# Patient Record
Sex: Female | Born: 1985 | Hispanic: Yes | Marital: Single | Smoking: Former smoker
Health system: Southern US, Community
[De-identification: ages and names within clinical notes are randomized; demographics above are authoritative.]

## PROBLEM LIST (undated history)

## (undated) ENCOUNTER — Inpatient Hospital Stay (HOSPITAL_COMMUNITY): Payer: Self-pay

## (undated) DIAGNOSIS — I639 Cerebral infarction, unspecified: Secondary | ICD-10-CM

## (undated) HISTORY — PX: NO PAST SURGERIES: SHX2092

---

## 2011-06-10 ENCOUNTER — Encounter (HOSPITAL_COMMUNITY): Payer: Self-pay | Admitting: *Deleted

## 2011-06-10 ENCOUNTER — Inpatient Hospital Stay (HOSPITAL_COMMUNITY): Payer: Self-pay

## 2011-06-10 ENCOUNTER — Inpatient Hospital Stay (HOSPITAL_COMMUNITY)
Admission: AD | Admit: 2011-06-10 | Discharge: 2011-06-10 | Disposition: A | Discharge status: Home / Self Care | Payer: Self-pay | Source: Ambulatory Visit | Attending: Obstetrics & Gynecology | Admitting: Obstetrics & Gynecology

## 2011-06-10 DIAGNOSIS — O034 Incomplete spontaneous abortion without complication: Secondary | ICD-10-CM | POA: Insufficient documentation

## 2011-06-10 DIAGNOSIS — O99891 Other specified diseases and conditions complicating pregnancy: Secondary | ICD-10-CM | POA: Insufficient documentation

## 2011-06-10 DIAGNOSIS — O468X9 Other antepartum hemorrhage, unspecified trimester: Secondary | ICD-10-CM

## 2011-06-10 DIAGNOSIS — O074 Failed attempted termination of pregnancy without complication: Secondary | ICD-10-CM

## 2011-06-10 DIAGNOSIS — O418X90 Other specified disorders of amniotic fluid and membranes, unspecified trimester, not applicable or unspecified: Secondary | ICD-10-CM

## 2011-06-10 NOTE — ED Provider Notes (Signed)
History   The patient is a 25 y.o. year old G76P3003 female at 11.[redacted] weeks gestation who presents to MAU reporting having taken 10 Cytotec pills, unknown dose given to her by a friend to try to induce an abortion on 06/05/11. She reports experiencing cramping and diarrhea for two days, but no bleeding or passage of tissue.   CSN: [id number] Arrival date & time: 06/10/2011  1:43 PM  Chief Complaint  Patient presents with  . Possible Pregnancy    HPI  (Consider location/radiation/quality/duration/timing/severity/associated sxs/prior treatment)  HPI  History reviewed. No pertinent past medical history.  No past surgical history on file.  No family history on file.  History  Substance Use Topics  . Smoking status: Not on file  . Smokeless tobacco: Not on file  . Alcohol Use: Not on file    OB History    Grav Para Term Preterm Abortions TAB SAB Ect Mult Living   5 3 3       3       Review of Systems  Review of Systems  Allergies  Review of patient's allergies indicates no known allergies.  Home Medications  No current outpatient prescriptions on file.  Physical Exam    BP 119/67  Pulse 63  Temp(Src) 98.9 F (37.2 C) (Oral)  Resp 16  Ht 4\' 8"  (1.422 m)  Wt 60.555 kg (133 lb 8 oz)  BMI 29.93 kg/m2  LMP 03/20/2011  Physical Exam Abd: NT Pelvic: UTA uterus well due to body habitus. No VB  ED Course  Procedures (including critical care time)  Labs Reviewed  HCG, QUANTITATIVE, PREGNANCY - Abnormal; Notable for the following:    hCG, Beta Chain, Quant, S 31425 (*)    All other components within normal limits  ABO/RH   US Ob Comp Less 14 Wks SIUP measuring 5.5 weeks. No cardiac activity, but embryo is a threshold for expecting to see cardiac activity. Mod SCH noted.   Assessment: 1. Likely incomplete AB vs early viable IUP too early to see FHR  Plan: 1. F/U in 1 week to repeat US 2. Bleeding precautions 3. Discussed danger of induced abortion w/out medical  supervision.  Dorathy Kinsman 06/10/2011 11:38 PM

## 2011-06-10 NOTE — Progress Notes (Signed)
Pt  Took a home preg test that came positive. She took 10 pills to abort pregnancy.  However, she never bled or passed any tissue.  She did have lower abd cramping followed by at least 2 hrs of diarrhea.

## 2011-06-10 NOTE — Progress Notes (Signed)
Pt states, " A friend of mine gave me Ten Cytotec a week ago to abort my pregnancy.  I started having cramping pain and I passed some mucusy liquid, and then a little bit of blood."

## 2011-06-17 ENCOUNTER — Encounter (HOSPITAL_COMMUNITY): Payer: Self-pay

## 2011-06-17 ENCOUNTER — Inpatient Hospital Stay (HOSPITAL_COMMUNITY): Payer: Self-pay

## 2011-06-17 ENCOUNTER — Inpatient Hospital Stay (HOSPITAL_COMMUNITY)
Admission: AD | Admit: 2011-06-17 | Discharge: 2011-06-17 | Disposition: A | Discharge status: Home / Self Care | Payer: Self-pay | Source: Ambulatory Visit | Attending: Obstetrics and Gynecology | Admitting: Obstetrics and Gynecology

## 2011-06-17 DIAGNOSIS — O021 Missed abortion: Secondary | ICD-10-CM | POA: Insufficient documentation

## 2011-06-17 LAB — HCG, QUANTITATIVE, PREGNANCY: hCG, Beta Chain, Quant, S: 32233 m[IU]/mL — ABNORMAL HIGH (ref <5)

## 2011-06-17 MED ORDER — MISOPROSTOL 200 MCG PO TABS: ORAL_TABLET | ORAL | Status: DC

## 2011-06-17 MED ORDER — HYDROCODONE-ACETAMINOPHEN 5-500 MG PO TABS: 1.0000 | ORAL_TABLET | Freq: Four times a day (QID) | ORAL | Status: AC | PRN

## 2011-06-17 MED ORDER — PROMETHAZINE HCL 25 MG PO TABS: 25.0000 mg | ORAL_TABLET | Freq: Four times a day (QID) | ORAL | Status: AC | PRN

## 2011-06-17 NOTE — ED Provider Notes (Signed)
History   Pt presents today for repeat B-quant and [place]. She states she is doing well and has no complaints. She has a hx of taking 10 pills of some unknown medication to induce an abortion. She denies bleeding or any other problems.  Chief Complaint  Patient presents with  . Follow-up   HPI  OB History    Grav Para Term Preterm Abortions TAB SAB Ect Mult Living   5 3 3       3       No past medical history on file.  No past surgical history on file.  No family history on file.  History  Substance Use Topics  . Smoking status: Not on file  . Smokeless tobacco: Not on file  . Alcohol Use: Not on file    Allergies: No Known Allergies  Prescriptions prior to admission  Medication Sig Dispense Refill  . magnesium hydroxide (MILK OF MAGNESIA) 400 MG/5ML suspension Take 5 mLs by mouth daily as needed. Patient took medication for constipation.         Review of Systems  Constitutional: Negative for fever.  Cardiovascular: Negative for chest pain.  Gastrointestinal: Positive for nausea. Negative for vomiting, abdominal pain, diarrhea and constipation.  Genitourinary: Negative for dysuria, urgency, frequency and hematuria.  Neurological: Negative for dizziness and headaches.  Psychiatric/Behavioral: Negative for depression and suicidal ideas.   Physical Exam   Blood pressure 119/71, pulse 75, temperature 98.4 F (36.9 C), temperature source Oral, resp. rate 16, last menstrual period 03/20/2011.  Physical Exam  Constitutional: She is oriented to person, place, and time. She appears well-developed and well-nourished. No distress.  HENT:  Head: Normocephalic and atraumatic.  Eyes: EOM are normal. Pupils are equal, round, and reactive to light.  GI: Soft. She exhibits no distension. There is no tenderness. There is no rebound and no guarding.  Neurological: She is alert and oriented to person, place, and time.  Skin: Skin is warm and dry. She is not diaphoretic.  Psychiatric:  She has a normal mood and affect. Her behavior is normal. Judgment and thought content normal.    MAU Course  Procedures  Results for orders placed during the hospital encounter of 06/17/11 (from the past 24 hour(s))  HCG, QUANTITATIVE, PREGNANCY     Status: Abnormal   Collection Time   06/17/11 10:51 AM      Component Value Range   hCG, Beta Chain, Quant, S 32233 (*) <5 (mIU/mL)   US shows no growth and meets criteria for failed IUP.  Assessment and Plan  Missed AB: discussed with pt at length via interpreter. Discussed expectant management vs cytotec. Pt desires cytotec. Will give Rx for cytotec, lortab, and phenergan. Discussed diet, activity, risks, and precautions. She will return in 2wks for reevaluation.  Clinton Gallant. Ashya Nicolaisen III, DrHSc, MPAS, PA-C  06/17/2011, 2:18 PM   Henrietta Hoover, PA 06/17/11 1422

## 2011-06-17 NOTE — Progress Notes (Signed)
Pt to MAU for f/u ultrasound for confirmation of viability. Pt denies any pain or bleeding.

## 2011-06-18 ENCOUNTER — Inpatient Hospital Stay (HOSPITAL_COMMUNITY)
Admission: AD | Admit: 2011-06-18 | Discharge: 2011-06-18 | Disposition: A | Discharge status: Home / Self Care | Payer: Self-pay | Source: Ambulatory Visit | Attending: Obstetrics & Gynecology | Admitting: Obstetrics & Gynecology

## 2011-06-18 DIAGNOSIS — O021 Missed abortion: Secondary | ICD-10-CM

## 2011-06-18 NOTE — ED Provider Notes (Signed)
Agree with above note.  Ramesh Moan 06/18/2011 7:59 AM   

## 2011-06-18 NOTE — Progress Notes (Signed)
Hospital translator in with patient for triage. Pt states she had Cytotec on 10-1 and expected to have a lot of pain and bleeding and she did not. Just had questions. Jenean Lindau, PA in triage to answer questions.

## 2011-06-18 NOTE — ED Provider Notes (Signed)
History   Pt presents today to ask questions only. She was given an Rx for cytotec yesterday for a missed ab. The pt did not have as much bleeding as she thought she should, so she was concerned. She denies heavy bleeding now, abd pain, or any other sx at this time. All information was obtained via interpreter.  No chief complaint on file.  HPI  OB History    Grav Para Term Preterm Abortions TAB SAB Ect Mult Living   5 3 3       3       No past medical history on file.  No past surgical history on file.  No family history on file.  History  Substance Use Topics  . Smoking status: Not on file  . Smokeless tobacco: Not on file  . Alcohol Use: Not on file    Allergies: No Known Allergies  Prescriptions prior to admission  Medication Sig Dispense Refill  . HYDROcodone-acetaminophen (VICODIN) 5-500 MG per tablet Take 1 tablet by mouth every 6 (six) hours as needed for pain.  30 tablet  0  . magnesium hydroxide (MILK OF MAGNESIA) 400 MG/5ML suspension Take 5 mLs by mouth daily as needed. Patient took medication for constipation.       . misoprostol (CYTOTEC) 200 MCG tablet Take all four tabs as a single dose by mouth.  4 tablet  0  . promethazine (PHENERGAN) 25 MG tablet Take 1 tablet (25 mg total) by mouth every 6 (six) hours as needed for nausea.  30 tablet  0    Review of Systems  Constitutional: Negative for fever.  Cardiovascular: Negative for chest pain.  Gastrointestinal: Negative for nausea, vomiting, abdominal pain, diarrhea and constipation.  Genitourinary: Negative for dysuria, urgency, frequency and hematuria.  Neurological: Negative for dizziness and headaches.  Psychiatric/Behavioral: Negative for depression and suicidal ideas.   Physical Exam   Blood pressure 140/87, pulse 98, temperature 98.7 F (37.1 C), temperature source Oral, resp. rate 16, last menstrual period 03/20/2011.  Physical Exam  Constitutional: She is oriented to person, place, and time. She  appears well-developed and well-nourished. No distress.  HENT:  Head: Normocephalic and atraumatic.  Eyes: EOM are normal. Pupils are equal, round, and reactive to light.  GI: Soft. She exhibits no distension. There is no tenderness. There is no rebound and no guarding.  Neurological: She is alert and oriented to person, place, and time.  Skin: Skin is warm and dry. She is not diaphoretic.  Psychiatric: She has a normal mood and affect. Her behavior is normal. Judgment and thought content normal.    MAU Course  Procedures    Assessment and Plan  Missed AB: discussed with pt at length including anticipated response with cytotec. Pt has been instructed to return in 2 wks for reevaluation. Reassured pt. Pt states she understands and agrees. All information was given via interpreter.  Clinton Gallant. Rice III, DrHSc, MPAS, PA-C  06/18/2011, 5:40 PM   Henrietta Hoover, PA 06/18/11 1744

## 2011-06-19 NOTE — ED Provider Notes (Signed)
Attestation of Attending Supervision of Advanced Practitioner: Evaluation and management procedures were performed by the PA/NP/CNM/OB Fellow under my supervision/collaboration. Chart reviewed and agree with management and plan.  Estle Sabella A 06/19/2011 10:57 AM

## 2011-06-25 ENCOUNTER — Inpatient Hospital Stay (HOSPITAL_COMMUNITY)
Admission: AD | Admit: 2011-06-25 | Discharge: 2011-06-25 | Disposition: A | Discharge status: Home / Self Care | Payer: Self-pay | Source: Ambulatory Visit | Attending: Family Medicine | Admitting: Family Medicine

## 2011-06-25 ENCOUNTER — Inpatient Hospital Stay (HOSPITAL_COMMUNITY): Payer: Self-pay

## 2011-06-25 ENCOUNTER — Encounter (HOSPITAL_COMMUNITY): Payer: Self-pay | Admitting: *Deleted

## 2011-06-25 DIAGNOSIS — O021 Missed abortion: Secondary | ICD-10-CM | POA: Insufficient documentation

## 2011-06-25 LAB — CBC
Hemoglobin: 11.7 g/dL — ABNORMAL LOW (ref 12.0–15.0)
MCHC: 34.1 g/dL (ref 30.0–36.0)
RDW: 12.7 % (ref 11.5–15.5)
WBC: 9.3 10*3/uL (ref 4.0–10.5)

## 2011-06-25 LAB — HCG, QUANTITATIVE, PREGNANCY: hCG, Beta Chain, Quant, S: 23186 m[IU]/mL — ABNORMAL HIGH (ref <5)

## 2011-06-25 MED ORDER — MISOPROSTOL 200 MCG PO TABS
800.0000 ug | ORAL_TABLET | Freq: Once | ORAL | Status: AC
  Administered 2011-06-25: 800 ug via VAGINAL
  Filled 2011-06-25: qty 4

## 2011-06-25 NOTE — Progress Notes (Signed)
Last week given cytotec for SAB and has no bleeding  Or cramping, 3 home UPT +, concerned that she has not responded to cytotec

## 2011-06-25 NOTE — ED Provider Notes (Signed)
History   The patient is a 25 y.o. year old G60P4004 female who state she took cytotec sold to her by a friend to induce abortion on 06/05/11. She was Dx w/ missed AB on 06/17/11. She was given cytotec which she took orally at home. She returned to MAU today reporting no bleeding or passage of tissue.    CSN: [id number] Arrival date & time: 06/25/2011  1:44 PM  Chief Complaint  Patient presents with  . Threatened Miscarriage    (Consider location/radiation/quality/duration/timing/severity/associated sxs/prior treatment) HPI  No past medical history on file.  No past surgical history on file.  No family history on file.  History  Substance Use Topics  . Smoking status: Not on file  . Smokeless tobacco: Not on file  . Alcohol Use:     OB History    Grav Para Term Preterm Abortions TAB SAB Ect Mult Living   5 4 4       4       Review of Systems: Neg  Allergies  Review of patient's allergies indicates no known allergies.  Home Medications  No current outpatient prescriptions on file.  BP 114/74  Pulse 78  Temp 98.9 F (37.2 C)  Resp 18  Ht 4\' 11"  (1.499 m)  LMP 03/20/2011  Physical Exam:  Abd soft, NT Pelvic: Neg for bleeding or discharge. Cervix long and closed. Uterus slightly enlarged, NT ED Course  Procedures (including critical care time)  Labs Reviewed  HCG, QUANTITATIVE, PREGNANCY - Abnormal; Notable for the following:    hCG, Beta Chain, Quant, S 23186 (*)    All other components within normal limits  CBC - Abnormal; Notable for the following:    Hemoglobin 11.7 (*)    HCT 34.3 (*)    All other components within normal limits     Procedures: Cytotec 800 mcg placed vaginally by CNM  MDM  Assessment: 1. Missed AB  Plan: 1. R/B/I expectant management, repeat cytotec vaginally or D/C were discussed. Pt elects Cytotec PV. 2. Return 07/01/11 for eval. Schedule D/C PRN 3. Bleeding precautions  Dorathy Kinsman 06/24/2011 9:03 PM

## 2011-06-25 NOTE — Progress Notes (Signed)
Pt returns to mau due to no results from cytotec for missed ab.  Denies any bleeding or cramping.

## 2011-06-27 NOTE — ED Provider Notes (Signed)
Attestation of Attending Supervision of Advanced Practitioner: Evaluation and management procedures were performed by the PA/NP/CNM/OB Fellow under my supervision/collaboration. Chart reviewed and agree with management and plan.  ANYANWU,UGONNA A 06/27/2011 10:21 AM   

## 2011-07-03 ENCOUNTER — Encounter (HOSPITAL_COMMUNITY): Payer: Self-pay | Admitting: *Deleted

## 2011-07-03 ENCOUNTER — Inpatient Hospital Stay (HOSPITAL_COMMUNITY): Payer: Self-pay

## 2011-07-03 ENCOUNTER — Inpatient Hospital Stay (HOSPITAL_COMMUNITY)
Admission: AD | Admit: 2011-07-03 | Discharge: 2011-07-03 | Disposition: A | Discharge status: Home / Self Care | Payer: Self-pay | Source: Ambulatory Visit | Attending: Obstetrics & Gynecology | Admitting: Obstetrics & Gynecology

## 2011-07-03 DIAGNOSIS — R109 Unspecified abdominal pain: Secondary | ICD-10-CM | POA: Insufficient documentation

## 2011-07-03 DIAGNOSIS — O039 Complete or unspecified spontaneous abortion without complication: Secondary | ICD-10-CM | POA: Insufficient documentation

## 2011-07-03 LAB — HCG, QUANTITATIVE, PREGNANCY: hCG, Beta Chain, Quant, S: 3486 m[IU]/mL — ABNORMAL HIGH (ref <5)

## 2011-07-03 LAB — CBC
Hemoglobin: 11.1 g/dL — ABNORMAL LOW (ref 12.0–15.0)
MCV: 88.7 fL (ref 78.0–100.0)
Platelets: 247 10*3/uL (ref 150–400)
RBC: 3.62 MIL/uL — ABNORMAL LOW (ref 3.87–5.11)
WBC: 7.6 10*3/uL (ref 4.0–10.5)

## 2011-07-03 NOTE — ED Provider Notes (Signed)
History     Chief Complaint  Patient presents with  . Abdominal Pain   HPI Kaitlin Koch 25 y.o. returns today with heavy bleeding this AM.  Thinks she passed the pregnancy this morning.  Is also having upper abdominal pain with nausea and vomiting earlier this week.  Requesting stronger pain medication as she cannot work with the abdominal pain she is having.  Currently is not having pain.  Is having some vaginal bleeding but less than this morning.   OB History    Grav Para Term Preterm Abortions TAB SAB Ect Mult Living   4 3 3       4       Past Medical History  Diagnosis Date  . No pertinent past medical history     Past Surgical History  Procedure Date  . No past surgeries     No family history on file.  History  Substance Use Topics  . Smoking status: Never Smoker   . Smokeless tobacco: Never Used  . Alcohol Use: No    Allergies: No Known Allergies  Prescriptions prior to admission  Medication Sig Dispense Refill  . acetaminophen (TYLENOL) 325 MG tablet Take 650 mg by mouth every 6 (six) hours as needed.        Marland Kitchen CALCIUM PO Take 1 tablet by mouth daily.        . magnesium hydroxide (MILK OF MAGNESIA) 400 MG/5ML suspension Take 5 mLs by mouth daily as needed. Patient took medication for constipation.       . prenatal vitamin w/FE, FA (PRENATAL 1 + 1) 27-1 MG TABS Take 1 tablet by mouth daily.        . promethazine (PHENERGAN) 25 MG tablet Take 1 tablet (25 mg total) by mouth every 6 (six) hours as needed for nausea.  30 tablet  0    Review of Systems  Gastrointestinal: Positive for nausea and abdominal pain.  Genitourinary:       Vaginal bleeding   Physical Exam   Blood pressure 115/73, pulse 72, temperature 98.7 F (37.1 C), temperature source Oral, resp. rate 20, height 4\' 10"  (1.473 m), last menstrual period 03/20/2011, SpO2 97.00%.  Physical Exam  Nursing note and vitals reviewed. Constitutional: She is oriented to person, place, and time. She  appears well-developed and well-nourished.  HENT:  Head: Normocephalic.  Eyes: EOM are normal.  Neck: Neck supple.  GI: Soft. There is no tenderness.       Reports pain has been in upper midline of abdomen.  Musculoskeletal: Normal range of motion.  Neurological: She is alert and oriented to person, place, and time.  Skin: Skin is warm and dry.  Psychiatric: She has a normal mood and affect.    MAU Course  Procedures  MDM Ultrasound Clinical Data: Evaluate for retained products of conception.  TRANSVAGINAL OB ULTRASOUND  Technique: Transvaginal ultrasound was performed for evaluation of  the gestation as well as the maternal uterus and adnexal regions.  Comparison: 06/25/2011.  Findings: There is no intrauterine gestational sac. Endometrium is  somewhat irregular and heterogeneous in appearance, measuring 10 mm  in thickness. No internal blood flow. Ovaries are unremarkable.  No free fluid.  IMPRESSION:  Mild endometrial irregularity and heterogeneity without internal  flow. While this may represent blood products and/or fluid,  retained products of conception cannot be definitively excluded.   Results for orders placed during the hospital encounter of 07/03/11 (from the past 24 hour(s))  CBC  Status: Abnormal   Collection Time   07/03/11  4:12 PM      Component Value Range   WBC 7.6  4.0 - 10.5 (K/uL)   RBC 3.62 (*) 3.87 - 5.11 (MIL/uL)   Hemoglobin 11.1 (*) 12.0 - 15.0 (g/dL)   HCT 16.1 (*) 09.6 - 46.0 (%)   MCV 88.7  78.0 - 100.0 (fL)   MCH 30.7  26.0 - 34.0 (pg)   MCHC 34.6  30.0 - 36.0 (g/dL)   RDW 04.5  40.9 - 81.1 (%)   Platelets 247  150 - 400 (K/uL)  HCG, QUANTITATIVE, PREGNANCY     Status: Abnormal   Collection Time   07/03/11  4:12 PM      Component Value Range   hCG, Beta Chain, Quant, S 3486 (*) <5 (mIU/mL)   Previously BHCG was 23,000.  Is falling well.  Assessment and Plan  Completed miscarriage Abdominal pain likely from oxycodone which she  has been taking for pain.  Plan: Your miscarriage has completed. You may have vaginal bleeding for a few weeks. Will have the GYN clinic call you for a follow up appointment. No sex for 3 weeks. Use condoms each time you have intercourse so you do not get pregnant. Stop oxycodone pain medication Since the miscarriage has happened, Ibuprofen will be a good pain medication for you. Make sure you take Ibuprofen with food and by the package directions.    Cliffard Hair 07/03/2011, 4:06 PM   Nolene Bernheim, NP 07/03/11 1748

## 2011-07-03 NOTE — Progress Notes (Signed)
Pt states she has had Cytotec for an incomplete SAB about 2 weeks ago. Has continued to have bleeding on and off, states bleeding was heavy this am, less not. Started having upper abdominal pain yesterday with nausea and vomiting and state the pills for pain she was given before are not working.

## 2011-07-04 NOTE — ED Provider Notes (Signed)
Attestation of Attending Supervision of Advanced Practitioner: Evaluation and management procedures were performed by the PA/NP/CNM/OB Fellow under my supervision/collaboration. Chart reviewed and agree with management and plan.  Latiqua Daloia A 07/04/2011 2:47 PM   

## 2011-08-12 ENCOUNTER — Encounter: Payer: Self-pay | Admitting: Advanced Practice Midwife

## 2011-09-17 NOTE — L&D Delivery Note (Signed)
I was present for the delivery and agree with above.  [place], CNM 06/08/2012 12:08 AM

## 2011-09-17 NOTE — L&D Delivery Note (Signed)
Delivery Note At 11:32 PM a viable and healthy female was delivered via Vaginal, Spontaneous Delivery (Presentation: right occiput anterior).  APGAR: 9, 9.   Placenta status: Intact, Spontaneous, trailing membranes.  Cord: 3 vessels with the following complications: Short.  Anesthesia: None  Episiotomy: None Lacerations: None Est. Blood Loss (mL): 150 mL  Mom to postpartum.  Baby to nursery-stable. Mom planning to breast-feed and wants outpatient circumcision.  [name], All 06/07/2012, 11:49 PM

## 2011-12-23 ENCOUNTER — Other Ambulatory Visit: Payer: Self-pay

## 2011-12-23 DIAGNOSIS — Z331 Pregnant state, incidental: Secondary | ICD-10-CM

## 2011-12-23 NOTE — Progress Notes (Signed)
Prenatal labs done today Kaitlin Koch 

## 2011-12-24 LAB — OBSTETRIC PANEL
Basophils Relative: 0 % (ref 0–1)
Eosinophils Absolute: 0.1 10*3/uL (ref 0.0–0.7)
Eosinophils Relative: 1 % (ref 0–5)
Hepatitis B Surface Ag: NEGATIVE
Lymphs Abs: 2.7 10*3/uL (ref 0.7–4.0)
MCH: 29.5 pg (ref 26.0–34.0)
MCHC: 32.1 g/dL (ref 30.0–36.0)
MCV: 91.9 fL (ref 78.0–100.0)
Neutrophils Relative %: 69 % (ref 43–77)
Platelets: 273 10*3/uL (ref 150–400)
RBC: 3.93 MIL/uL (ref 3.87–5.11)
Rh Type: POSITIVE

## 2011-12-24 LAB — HIV ANTIBODY (ROUTINE TESTING W REFLEX): HIV: NONREACTIVE

## 2011-12-25 LAB — CULTURE, OB URINE
Colony Count: NO GROWTH
Organism ID, Bacteria: NO GROWTH

## 2011-12-30 ENCOUNTER — Ambulatory Visit (INDEPENDENT_AMBULATORY_CARE_PROVIDER_SITE_OTHER): Payer: Self-pay | Admitting: Family Medicine

## 2011-12-30 ENCOUNTER — Encounter: Payer: Self-pay | Admitting: Family Medicine

## 2011-12-30 VITALS — BP 110/72 | Temp 98.4°F | Wt 157.2 lb

## 2011-12-30 DIAGNOSIS — Z3480 Encounter for supervision of other normal pregnancy, unspecified trimester: Secondary | ICD-10-CM

## 2011-12-30 DIAGNOSIS — Z3490 Encounter for supervision of normal pregnancy, unspecified, unspecified trimester: Secondary | ICD-10-CM

## 2011-12-30 NOTE — Progress Notes (Signed)
Initial OB:   Here today for initial OB visit. Patient reports no complications this pregnancy. Last menstrual period 08/20/2011. Menses at age 26 with regular monthly menstrual periods since that time.  Has not sought prenatal care previous to this time due to not having insurance. Patient working with [facility]. Patient has taken a prenatal vitamin the majority of the pregnancy. Reports an allergic reaction to a prescription strength vitamin involving rash. Patient has been taking over-the-counter prenatal vitamins without comp occasions. Patient reports good fetal movement. Denies contractions, vaginal discharge, headache, vision change, right upper quadrant pain, fever, rash.  Past medical, family, and previous pregnancy histories reviewed and noted in the chart. Prenatal labs reviewed and in the chart  Last Pap 1 year ago which was normal. No Paps previous that time.  Review of systems: See history of present illness   Assessment and plan: 26 year old 18 week 16 day [id number] with normal pregnancy w/ no PNC up to this point.  - Quad screen today - Pt to try and receive the orange card. If able, will order anatomy scan. If not pt to receive Anatomy scan at Dr. Elsie Stain office. - Pt given handout in spanish on 2nd trimester.  - Pap, GC/Chlamydia today.

## 2011-12-30 NOTE — Patient Instructions (Signed)
Please come back to see me in 1 month for your next prenatal exam.  Hemorragia vaginal durante el embarazo, segundo trimestre (Vaginal Bleeding During Pregnancy, Second Trimester) Durante el embarazo es relativamente frecuente que se presente una pequea hemorragia (manchas). Esta situacin generalmente mejora por s misma. Existen muchas causas para la hemorragia o prdidas durante el embarazo. Algunas hemorragias pueden estar relacionadas al [employer] y otras no. Si aparecen retortijones con la hemorragia es ms serio y preocupante. Informe al mdico si tiene cualquier tipo de hemorragia vaginal.   CAUSAS  Infeccin, inflamacin o tumores en el tero.   La placenta puede estar cubriendo la apertura del tero de [place] total o parcial.   La placenta puede haberse separado del tero.   Usted puede estar sufriendo un trabajo de parto prematuro.   El tero no est lo suficientemente fuerte para Pharmacologist al beb dentro del tero (insuficiencia cervical).   Muchos quistes pequeos en el tero en lugar del tejido del embarazo (embarazo molar).  SNTOMAS  Hemorragia o goteo vaginal con o sin retortijones.   Contracciones uterinas.   Flujo vaginal anormal.   Podra tener goteo despus de Management consultant.  DIAGNSTICO Para evaluar el embarazo, el mdico podr:  Education officer, environmental un examen plvico.   Tomar anlisis de [place].   Realizar una prueba de [place].  Es importante seguir las indicaciones del profesional que la asiste.   TRATAMIENTO  Evaluacin del embarazo con anlisis de [place] y [place].   Reposo en cama (slo levantarse para ir al bao).   Inmunizacin con Rho-gam si la madre es Rh negativo y el padre es Rh positivo.   Si tiene contracciones uterinas, se le podrn administrar medicamentos para detenerlas.   Si tiene insuficiencia cervical, se le podr realizar una sutura en el tero para cerrarlo.  INSTRUCCIONES PARA EL CUIDADO DOMICILIARIO  Si el mdico  le indica reposo en cama, usted necesitar hacer algunos arreglos para que otra persona se ocupe del cuidado de los nios y de otras responsabilidades adicionales. Sin embargo, podr autorizarla a Building surveyor.   Lleve un registro de la cantidad y la saturacin de las toallas higinicas que Landscape architect. Escriba esta informacin:   No use tampones. No utilice duchas vaginales.   No tenga relaciones sexuales u orgasmos hasta que el mdico la autorice.   Guarde una muestra de los tejidos para que el profesional lo inspeccione.   Tome medicamentos para el dolor slo con el permiso del mdico.   No tome aspirina, ya que puede causar hemorragias.   No realice ejercicio, actividades estresantes ni levante peso sin el permiso del mdico.  SOLICITE ATENCIN MDICA INMEDIATAMENTE SI:  Siente calambres intensos en el estmago, en la espalda o en el vientre (abdomen).   Tiene contracciones uterinas.   Usted tiene una temperatura oral de ms de 102 F (38.9 C) y no puede controlarla con medicamentos.   Comienza a sentir escalofros.   Elimina cogulos o tejidos grandes.   La hemorragia aumenta o se siente mareada dbil o tiene episodios de desmayos.   Tiene una prdida de lquido por la vagina.  Document Released: 06/12/2005 Document Revised: 08/22/2011 [facility] Patient Information 2012 [place], [place].

## 2012-01-06 NOTE — Progress Notes (Signed)
Pt seen by me with Dr. Konrad Dolores.  I agree with his note and care. Late to care due to financial constraints.   Pt should have early glucola for obesity.

## 2012-01-22 ENCOUNTER — Ambulatory Visit (INDEPENDENT_AMBULATORY_CARE_PROVIDER_SITE_OTHER): Payer: Self-pay | Admitting: Family Medicine

## 2012-01-22 VITALS — BP 105/70 | Temp 98.4°F | Wt 159.0 lb

## 2012-01-22 DIAGNOSIS — Z3480 Encounter for supervision of other normal pregnancy, unspecified trimester: Secondary | ICD-10-CM

## 2012-01-22 DIAGNOSIS — Z331 Pregnant state, incidental: Secondary | ICD-10-CM

## 2012-01-22 LAB — GLUCOSE, CAPILLARY: Glucose-Capillary: 94 mg/dL (ref 70–99)

## 2012-01-22 NOTE — Patient Instructions (Signed)
Thank you for coming in today. You and the baby are doing great. Please come back in 1 month, for your next prenatal appointment.    Gracias por venir hoy. Usted y el beb estn haciendo bien. Por favor, vuelve en 1 mes, para la prxima consulta prenatal.  Kaitlin Koch - Segundo trimestre (Pregnancy - Second Trimester) El segundo trimestre del Psychiatrist (del 3 al ) es un perodo de evolucin rpida para usted y el beb. Hacia el final del sexto mes, el beb mide aproximadamente 23 cm y pesa 680 g. Comenzar a Pharmacologist del beb [place] 18 y las 20 [address] de [place]. Podr sentir las pataditas ("quickening en ingls"). Hay un rpido [employer]. Puede segregar un lquido claro Charity fundraiser) de las [place]. Quizs sienta pequeas contracciones en el vientre (tero) Esto se conoce como falso trabajo de parto o contracciones de Braxton-Hicks. Es como una prctica del trabajo de parto que se produce cuando el beb est listo para salir. Generalmente los problemas de vmitos matinales ya se han superado hacia el final del Medical sales representative. Algunas mujeres desarrollan pequeas manchas oscuras (que se denominan cloasma, mscara del embarazo) en la cara que normalmente se van luego del nacimiento del beb. La exposicin al sol empeora las manchas. Puede desarrollarse acn en algunas mujeres embarazadas, y puede desaparecer en aquellas que ya tienen acn. EXAMENES PRENATALES  Durante los [employer], deber seguir realizando pruebas de [place], segn avance el [place]. Estas pruebas se realizan para controlar su salud y la del beb. Tambin se realizan anlisis de sangre para [employer] niveles de [place]. La anemia (bajo nivel de hemoglobina) es frecuente durante el embarazo. Para prevenirla, se administran hierro y vitaminas. Tambin se le realizarn exmenes para saber si tiene diabetes entre las 24 y las 28 semanas del [place]. Podrn repetirle algunas de las [employer]  hicieron previamente.   En cada visita le medirn el tamao del tero. Esto se realiza para asegurarse de que el beb est creciendo correctamente de acuerdo al estado del [place].   Tambin en cada visita prenatal controlarn su presin arterial. Esto se realiza para asegurarse de que no tenga toxemia.   Se controlar su orina para asegurarse de que no tenga infecciones, diabetes o protena en la orina.   Se controlar su peso regularmente para asegurarse que el aumento ocurre al ritmo indicado. Esto se hace para asegurarse que usted y el beb tienen una evolucin normal.   En algunas ocasiones se realiza una prueba de ultrasonido para confirmar el correcto desarrollo y evolucin del beb. Esta prueba se realiza con ondas sonoras inofensivas para el beb, de modo que el profesional pueda calcular ms precisamente la fecha del [place].  Algunas veces se realizan pruebas especializadas del lquido amnitico que rodea al beb. Esta prueba se denomina amniocentesis. El lquido amnitico se obtiene introduciendo una aguja en el vientre (abdomen). Se realiza para Conservator, museum/gallery en los que existe alguna preocupacin acerca de algn problema gentico que pueda sufrir el beb. En ocasiones se lleva a cabo cerca del final del embarazo, si es necesario inducir al [employer]. En este caso se realiza para asegurarse que los pulmones del beb estn lo suficientemente maduros como para que pueda vivir fuera del tero. CAMBIOS QUE OCURREN EN EL SEGUNDO TRIMESTRE DEL EMBARAZO Su organismo atravesar numerosos cambios durante el [employer]. Estos pueden variar de [name] persona a otra. [place] con el profesional que la asiste acerca los cambios que usted  note y que la preocupen.  Durante el segundo trimestre probablemente sienta un aumento del apetito. Es normal tener "antojos" de Development worker, community. Esto vara de [name] persona a otra y de un embarazo a Therapist, art.   El abdomen inferior comenzar a abultarse.    Podr tener la necesidad de Geographical information systems officer con ms frecuencia debido a que el tero y el beb presionan sobre la vejiga. Tambin es frecuente contraer ms infecciones urinarias durante el embarazo (dolor al [employer]oco[employer]). Puede evitarlas bebiendo gran cantidad de lquidos y vaciando la vejiga antes y despus de Sales promotion account executive.   Podrn aparecer las primeras estras en las caderas, abdomen y [place]. Estos son cambios normales del cuerpo durante el [place]. No existen medicamentos ni ejercicios que puedan prevenir [employer].   Es posible que comience a desarrollar venas inflamadas y abultadas (varices) en las piernas. El uso de medias de descanso, Optometrist sus pies durante 15 minutos, 3 a 4 veces al da y Film/video editor la sal en su dieta ayuda a Journalist, newspaper.   Podr sentir Engineering geologist gstrica a medida que el tero crece y Doctor, general practice. Puede tomar anticidos, con la autorizacin de su mdico, para Financial planner. Tambin es til ingerir pequeas comidas 4 a 5 veces al Futures trader.   La constipacin puede tratarse con un laxante o agregando fibra a su dieta. Beber grandes cantidades de lquidos, comer vegetales, frutas y granos integrales es de [place].   Tambin es beneficioso practicar actividad fsica. Si ha sido una persona Engineer, mining, podr continuar con la [employer]son de las actividades durante el mismo. Si ha sido [employer], puede ser beneficioso que comience con un programa de ejercicios, Museum/gallery exhibitions officer.   Puede desarrollar hemorroides (vrices en el recto) hacia el final del segundo trimestre. Tomar baos de asiento tibios y Chemical engineer cremas recomendadas por el profesional que lo asiste sern de ayuda para los problemas de hemorroides.   Tambin podr Financial risk analyst de espalda durante este momento de su embarazo. Evite levantar objetos pesados, utilice zapatos de taco bajo y [place] buena postura para ayudar a reducir los problemas de [place].    Algunas mujeres embarazadas desarrollan hormigueo y adormecimiento de la mano y los dedos debido a la hinchazn y compresin de los ligamentos de la mueca (sndrome del tnel carpiano). Esto desaparece una vez que el beb nace.   Como sus pechos se agrandan, Pension scheme manager un sujetador ms grande. Use un sostn de soporte, cmodo y de algodn. No utilice un sostn para amamantar hasta el ltimo mes de embarazo si va a amamantar al beb.   Podr observar una lnea oscura desde el ombligo hacia la zona pbica denominada linea nigra.   Podr observar que sus mejillas se ponen coloradas debido al aumento de flujo sanguneo en la cara.   Podr desarrollar "araitas" en la cara, cuello y pecho. Esto desaparece una vez que el beb nace.  INSTRUCCIONES PARA EL CUIDADO DOMICILIARIO  Es extremadamente importante que evite el cigarrillo, hierbas medicinales, alcohol y las drogas no prescriptas durante el Psychiatrist. Estas sustancias qumicas afectan la formacin y el desarrollo del beb. Evite estas sustancias durante todo el embarazo para asegurar el nacimiento de un beb sano.   La mayor parte de los cuidados que se aconsejan son los mismos que los indicados para Financial risk analyst trimestre del Psychiatrist. Cumpla con las citas tal como se le indic. Siga las instrucciones del profesional que lo asiste con respecto al [employer]  medicamentos, el ejercicio y Psychologist, forensic.   Durante el embarazo debe obtener nutrientes para usted y para su beb. Consuma alimentos balanceados a intervalos regulares. Elija alimentos como carne, pescado, [place] y otros productos lcteos descremados, vegetales, frutas, panes integrales y cereales. El Equities trader cul es el aumento de peso ideal.   Las relaciones sexuales fsicas pueden continuarse hasta cerca del fin del embarazo si no existen otros problemas. Estos problemas pueden ser la prdida temprana (prematura) de lquido amnitico de las [place], sangrado vaginal, dolor  abdominal u otros problemas mdicos o del Psychiatrist.   Realice [employer], si no tiene restricciones. Consulte con el profesional que la asiste si no sabe con certeza si determinados ejercicios son seguros. El mayor aumento de peso tiene Environmental consultant durante los ltimos 2 trimestres del Psychiatrist. El ejercicio la ayudar a:   Engineering geologist.   Ponerla en forma para el parto.   Ayudarla a perder peso luego de haber dado a luz.   Use un buen sostn o como los que se usan para hacer deportes para Paramedic la sensibilidad de las [place]. Tambin puede serle til si lo [place] mientras duerme. Si pierde Product manager, podr [employer].   No utilice la baera con agua caliente, baos turcos y saunas durante el [address]   Utilice el cinturn de seguridad sin excepcin cuando conduzca. Este la proteger a usted y al beb en caso de accidente.   Evite comer carne cruda, queso crudo, y el contacto con los utensilios y desperdicios de los gatos. Estos elementos contienen grmenes que pueden causar defectos de nacimiento en el beb.   El segundo trimestre es un buen momento para visitar a su dentista y Software engineer si an no lo ha hecho. Es Primary school teacher los dientes limpios. Utilice un cepillo de dientes blando. Cepllese ms suavemente durante el embarazo.   Es ms fcil perder algo de orina durante el [place]. Apretar y Chief Operating Officer los msculos de la pelvis la ayudar con este problema. Practique detener la miccin cuando est en el bao. Estos son los mismos msculos que Development worker, international aid. Son [employer] mismos msculos que utiliza cuando trata de [employer] gases. Puede practicar apretando estos msculos 10 veces, y repetir esto 3 veces por da aproximadamente. Una vez que conozca qu msculos debe apretar, no realice estos ejercicios durante la miccin. Puede favorecerle una infeccin si la orina vuelve hacia atrs.   Pida ayuda si tiene necesidades  econmicas, de asesoramiento o nutricionales durante el [place]. El profesional podr ayudarla con respecto a estas necesidades, o derivarla a otros especialistas.   La piel puede ponerse grasa. Si esto sucede, lvese la cara con un jabn [place], utilice un humectante no graso y [place] con base de aceite o crema.  CONSUMO DE MEDICAMENTOS Y DROGAS DURANTE EL EMBARAZO  Contine tomando las vitaminas apropiadas para esta etapa tal como se le indic. Las vitaminas deben contener un miligramo de cido flico y deben suplementarse con hierro. Guarde todas las vitaminas fuera del alcance de los nios. La ingestin de slo un par de vitaminas o tabletas que contengan hierro puede ocasionar la [employer] en un beb o en un nio pequeo.   Evite el uso de [place], inclusive los de venta libre y hierbas que no hayan sido prescriptos o indicados por el profesional que la asiste. Algunos medicamentos pueden causar problemas fsicos al beb. Utilice los medicamentos de venta libre o de prescripcin para Chief Technology Officer,  el malestar o la fiebre, segn se lo indique el profesional que lo asiste. No utilice aspirina.   El consumo de alcohol est relacionado con ciertos defectos de nacimiento. Esto incluye el sndrome de alcoholismo fetal. Debe evitar el consumo de alcohol en cualquiera de sus formas. El cigarrillo causa nacimientos prematuros y bebs de [place]. El uso de drogas recreativas est absolutamente prohibido. Son muy nocivas para el beb. Un beb que nace de [employer], ser adicto al nacer. Ese beb tendr los mismos sntomas de abstinencia que un adulto.   Infrmele al profesional si consume alguna droga.   No consuma drogas ilegales. Pueden causarle mucho dao al beb.  SOLICITE ATENCIN MDICA SI: Tiene preguntas o preocupaciones durante su embarazo. Es mejor que llame para Science writer las dudas que esperar hasta su prxima visita prenatal. Thressa Sheller forma se sentir ms tranquila.  SOLICITE ATENCIN  MDICA DE INMEDIATO SI:  La temperatura oral se eleva sin motivo por encima de 102 F (38.9 C) o segn le indique el profesional que lo asiste.   Tiene una prdida de lquido por la vagina (canal de parto). Si sospecha una ruptura de las [place], tmese la temperatura y llame al profesional para informarlo sobre esto.   Observa unas pequeas manchas, una hemorragia vaginal o elimina cogulos. Notifique al profesional acerca de la cantidad y de cuntos apsitos est utilizando. Unas pequeas manchas de sangre son algo comn durante el Psychiatrist, especialmente despus de Sales promotion account executive.   Presenta un olor desagradable en la secrecin vaginal y observa un cambio en el color, de transparente a blanco.   Contina con las nuseas y no obtiene alivio de los remedios indicados. Vomita sangre o algo similar a la borra del caf.   [place] o sube ms de 900 g. en una semana, o segn lo indicado por el profesional que la asiste.   Observa que se le [employer], las manos, los pies o las piernas.   Ha estado expuesta a la rubola y no ha sufrido la enfermedad.   Ha estado expuesta a la quinta enfermedad o a la varicela.   Presenta dolor abdominal. Las molestias en el ligamento redondo son [name] causa no cancerosa (benigna) frecuente de dolor abdominal durante el embarazo. El profesional que la asiste deber evaluarla.   Presenta dolor de cabeza intenso que no se [place].   Presenta fiebre, diarrea, dolor al orinar o le falta la respiracin.   Presenta dificultad para ver, visin borrosa, o visin doble.   Sufre una cada, un accidente de trnsito o cualquier tipo de trauma.   Vive en un hogar en el que existe violencia fsica o mental.  Document Released: 06/12/2005 Document Revised: 08/22/2011 [facility] Patient Information 2012 [place], [place].

## 2012-01-24 NOTE — Progress Notes (Signed)
No complaints today. Reports good fetal movement and no contractions or vaginal bleeding/discharge.  1hr glucola 94. Continues to try to get approved for Medicaid or Orange card.  [place] pending medicaid or orange card.  - Reasons to go to [facility] and labor precautions reviewed.  - Return in 4 wks or sooner if approved for Medicaid or Orange card in order to obtain [place].

## 2012-02-21 ENCOUNTER — Ambulatory Visit: Payer: Self-pay | Admitting: Family Medicine

## 2012-02-24 ENCOUNTER — Ambulatory Visit: Payer: Self-pay | Admitting: Family Medicine

## 2012-02-24 NOTE — Progress Notes (Signed)
Pt did not come w/ interpretor. Rescheduled for following week w/ fellow physician. Doppler results as above. No further information obtained. No orange card today.

## 2012-02-24 NOTE — Patient Instructions (Signed)
It was good to meet you  Be sure to get the orange card Come back next week for a visit and labs. If you have any abdominal bleeding, vaginal discharge, decreased fetal movement or any other concerning symptoms, please go to [facility],

## 2012-02-27 ENCOUNTER — Encounter: Payer: Self-pay | Admitting: Family Medicine

## 2012-03-06 ENCOUNTER — Ambulatory Visit (INDEPENDENT_AMBULATORY_CARE_PROVIDER_SITE_OTHER): Payer: Self-pay | Admitting: Family Medicine

## 2012-03-06 VITALS — BP 106/63 | Temp 97.7°F | Wt 163.2 lb

## 2012-03-06 DIAGNOSIS — Z3480 Encounter for supervision of other normal pregnancy, unspecified trimester: Secondary | ICD-10-CM

## 2012-03-06 DIAGNOSIS — Z3490 Encounter for supervision of normal pregnancy, unspecified, unspecified trimester: Secondary | ICD-10-CM

## 2012-03-06 NOTE — Patient Instructions (Addendum)
Thank you for coming in today  Please come back in 4 weeks after you get your ultrasound  Embarazo - [place] Aliment trimestre (Pregnancy - Second Trimester) El segundo trimestre del Psychiatrist (del 3 al ) es un perodo de evolucin rpida para usted y el beb. Hacia el final del sexto mes, el beb mide aproximadamente 23 cm y pesa 680 g. Comenzar a Pharmacologist del beb [place] 18 y las 20 [address] de [place]. Podr sentir las pataditas ("quickening en ingls"). Hay un rpido [employer]. Puede segregar un lquido claro Charity fundraiser) de las [place]. Quizs sienta pequeas contracciones en el vientre (tero) Esto se conoce como falso trabajo de parto o contracciones de Braxton-H[place]. Es como una prctica del trabajo de parto que se produce cuando el beb est listo para salir. Generalmente los problemas de vmitos matinales ya se han superado hacia el final del Medical sales representative. Algunas mujeres desarrollan pequeas manchas oscuras (que se denominan cloasma, mscara del embarazo) en la cara que normalmente se van luego del nacimiento del beb. La exposicin al sol empeora las manchas. Puede desarrollarse acn en algunas mujeres embarazadas, y puede desaparecer en aquellas que ya tienen acn. EXAMENES PRENATALES  Durante los [employer], deber seguir realizando pruebas de [place], segn avance el [place]. Estas pruebas se realizan para controlar su salud y la del beb. Tambin se realizan anlisis de sangre para [employer] niveles de [place]. La anemia (bajo nivel de hemoglobina) es frecuente durante el embarazo. Para prevenirla, se administran hierro y vitaminas. Tambin se le realizarn exmenes para saber si tiene diabetes entre las 24 y las 28 semanas del [place]. Podrn repetirle algunas de las [employer] hicieron previamente.   En cada visita le medirn el tamao del tero. Esto se realiza para asegurarse de que el beb est creciendo correctamente de acuerdo al estado del  [place].   Tambin en cada visita prenatal controlarn su presin arterial. Esto se realiza para asegurarse de que no tenga toxemia.   Se controlar su orina para asegurarse de que no tenga infecciones, diabetes o protena en la orina.   Se controlar su peso regularmente para asegurarse que el aumento ocurre al ritmo indicado. Esto se hace para asegurarse que usted y el beb tienen una evolucin normal.   En algunas ocasiones se realiza una prueba de ultrasonido para confirmar el correcto desarrollo y evolucin del beb. Esta prueba se realiza con ondas sonoras inofensivas para el beb, de modo que el profesional pueda calcular ms precisamente la fecha del [place].  Algunas veces se realizan pruebas especializadas del lquido amnitico que rodea al beb. Esta prueba se denomina amniocentesis. El lquido amnitico se obtiene introduciendo una aguja en el vientre (abdomen). Se realiza para Conservator, museum/gallery en los que existe alguna preocupacin acerca de algn problema gentico que pueda sufrir el beb. En ocasiones se lleva a cabo cerca del final del embarazo, si es necesario inducir al [employer]. En este caso se realiza para asegurarse que los pulmones del beb estn lo suficientemente maduros como para que pueda vivir fuera del tero. CAMBIOS QUE OCURREN EN EL SEGUNDO TRIMESTRE DEL EMBARAZO Su organismo atravesar numerosos cambios durante el [employer]. Estos pueden variar de [name] persona a otra. [place] con el profesional que la asiste acerca los cambios que usted note y que la preocupen.  Durante el segundo trimestre probablemente sienta un aumento del apetito. Es normal tener "antojos" de Development worker, community. Esto vara de [name] persona a otra y  de un embarazo a otro.   El abdomen inferior comenzar a abultarse.   Podr tener la necesidad de Geographical information systems officer con ms frecuencia debido a que el tero y el beb presionan sobre la vejiga. Tambin es frecuente contraer ms infecciones urinarias  durante el embarazo (dolor al [employer]oco[employer]illips). Puede evitarlas bebiendo gran cantidad de lquidos y vaciando la vejiga antes y despus de Sales promotion account executive.   Podrn aparecer las primeras estras en las caderas, abdomen y [place]. Estos son cambios normales del cuerpo durante el [place]. No existen medicamentos ni ejercicios que puedan prevenir [employer].   Es posible que comience a desarrollar venas inflamadas y abultadas (varices) en las piernas. El uso de medias de descanso, Optometrist sus pies durante 15 minutos, 3 a 4 veces al da y Film/video editor la sal en su dieta ayuda a Journalist, newspaper.   Podr sentir Engineering geologist gstrica a medida que el tero crece y Doctor, general practice. Puede tomar anticidos, con la autorizacin de su mdico, para Financial planner. Tambin es til ingerir pequeas comidas 4 a 5 veces al Futures trader.   La constipacin puede tratarse con un laxante o agregando fibra a su dieta. Beber grandes cantidades de lquidos, comer vegetales, frutas y granos integrales es de [place].   Tambin es beneficioso practicar actividad fsica. Si ha sido una persona Engineer, mining, podr continuar con la [employer]son de las actividades durante el mismo. Si ha sido [employer], puede ser beneficioso que comience con un programa de ejercicios, Museum/gallery exhibitions officer.   Puede desarrollar hemorroides (vrices en el recto) hacia el final del segundo trimestre. Tomar baos de asiento tibios y Chemical engineer cremas recomendadas por el profesional que lo asiste sern de ayuda para los problemas de hemorroides.   Tambin podr Financial risk analyst de espalda durante este momento de su embarazo. Evite levantar objetos pesados, utilice zapatos de taco bajo y [place] buena postura para ayudar a reducir los problemas de [place].   Algunas mujeres embarazadas desarrollan hormigueo y adormecimiento de la mano y los dedos debido a la hinchazn y compresin de los ligamentos de la mueca (sndrome del tnel  carpiano). Esto desaparece una vez que el beb nace.   Como sus pechos se agrandan, Pension scheme manager un sujetador ms grande. Use un sostn de soporte, cmodo y de algodn. No utilice un sostn para amamantar hasta el ltimo mes de embarazo si va a amamantar al beb.   Podr observar una lnea oscura desde el ombligo hacia la zona pbica denominada linea nigra.   Podr observar que sus mejillas se ponen coloradas debido al aumento de flujo sanguneo en la cara.   Podr desarrollar "araitas" en la cara, cuello y pecho. Esto desaparece una vez que el beb nace.  INSTRUCCIONES PARA EL CUIDADO DOMICILIARIO  Es extremadamente importante que evite el cigarrillo, hierbas medicinales, alcohol y las drogas no prescriptas durante el Psychiatrist. Estas sustancias qumicas afectan la formacin y el desarrollo del beb. Evite estas sustancias durante todo el embarazo para asegurar el nacimiento de un beb sano.   La mayor parte de los cuidados que se aconsejan son los mismos que los indicados para Financial risk analyst trimestre del Psychiatrist. Cumpla con las citas tal como se le indic. Siga las instrucciones del profesional que lo asiste con respecto al uso de los medicamentos, el ejercicio y Psychologist, forensic.   Durante el embarazo debe obtener nutrientes para usted y para su beb. Consuma alimentos balanceados a intervalos regulares. Elija alimentos como carne, pescado,  leche y otros productos lcteos descremados, vegetales, frutas, panes integrales y cereales. El Equities trader cul es el aumento de peso ideal.   Las relaciones sexuales fsicas pueden continuarse hasta cerca del fin del embarazo si no existen otros problemas. Estos problemas pueden ser la prdida temprana (prematura) de lquido amnitico de las [place], sangrado vaginal, dolor abdominal u otros problemas mdicos o del Psychiatrist.   Realice [employer], si no tiene restricciones. Consulte con el profesional que la asiste si no sabe con  certeza si determinados ejercicios son seguros. El mayor aumento de peso tiene Environmental consultant durante los ltimos 2 trimestres del Psychiatrist. El ejercicio la ayudar a:   Engineering geologist.   Ponerla en forma para el parto.   Ayudarla a perder peso luego de haber dado a luz.   Use un buen sostn o como los que se usan para hacer deportes para Paramedic la sensibilidad de las [place]. Tambin puede serle til si lo [place] mientras duerme. Si pierde Product manager, podr [employer].   No utilice la baera con agua caliente, baos turcos y saunas durante el [address]   Utilice el cinturn de seguridad sin excepcin cuando conduzca. Este la proteger a usted y al beb en caso de accidente.   Evite comer carne cruda, queso crudo, y el contacto con los utensilios y desperdicios de los gatos. Estos elementos contienen grmenes que pueden causar defectos de nacimiento en el beb.   El segundo trimestre es un buen momento para visitar a su dentista y Software engineer si an no lo ha hecho. Es Primary school teacher los dientes limpios. Utilice un cepillo de dientes blando. Cepllese ms suavemente durante el embarazo.   Es ms fcil perder algo de orina durante el [place]. Apretar y Chief Operating Officer los msculos de la pelvis la ayudar con este problema. Practique detener la miccin cuando est en el bao. Estos son los mismos msculos que Development worker, international aid. Son [employer] mismos msculos que utiliza cuando trata de [employer] gases. Puede practicar apretando estos msculos 10 veces, y repetir esto 3 veces por da aproximadamente. Una vez que conozca qu msculos debe apretar, no realice estos ejercicios durante la miccin. Puede favorecerle una infeccin si la orina vuelve hacia atrs.   Pida ayuda si tiene necesidades econmicas, de asesoramiento o nutricionales durante el [place]. El profesional podr ayudarla con respecto a estas necesidades, o derivarla a otros especialistas.   La piel puede  ponerse grasa. Si esto sucede, lvese la cara con un jabn [place], utilice un humectante no graso y [place] con base de aceite o crema.  CONSUMO DE MEDICAMENTOS Y DROGAS DURANTE EL EMBARAZO  Contine tomando las vitaminas apropiadas para esta etapa tal como se le indic. Las vitaminas deben contener un miligramo de cido flico y deben suplementarse con hierro. Guarde todas las vitaminas fuera del alcance de los nios. La ingestin de slo un par de vitaminas o tabletas que contengan hierro puede ocasionar la [employer] en un beb o en un nio pequeo.   Evite el uso de [place], inclusive los de venta libre y hierbas que no hayan sido prescriptos o indicados por el profesional que la asiste. Algunos medicamentos pueden causar problemas fsicos al beb. Utilice los medicamentos de venta libre o de prescripcin para Chief Technology Officer, Environmental health practitioner o la [place], segn se lo indique el profesional que lo asiste. No utilice aspirina.   El consumo de alcohol est relacionado con ciertos defectos de nacimiento. Esto  incluye el sndrome de alcoholismo fetal. Debe evitar el consumo de alcohol en cualquiera de sus formas. El cigarrillo causa nacimientos prematuros y bebs de [place]. El uso de drogas recreativas est absolutamente prohibido. Son muy nocivas para el beb. Un beb que nace de [employer], ser adicto al nacer. Ese beb tendr los mismos sntomas de abstinencia que un adulto.   Infrmele al profesional si consume alguna droga.   No consuma drogas ilegales. Pueden causarle mucho dao al beb.  SOLICITE ATENCIN MDICA SI: Tiene preguntas o preocupaciones durante su embarazo. Es mejor que llame para Science writer las dudas que esperar hasta su prxima visita prenatal. Thressa Sheller forma se sentir ms tranquila.  SOLICITE ATENCIN MDICA DE INMEDIATO SI:  La temperatura oral se eleva sin motivo por encima de 102 F (38.9 C) o segn le indique el profesional que lo asiste.   Tiene una prdida de lquido  por la vagina (canal de parto). Si sospecha una ruptura de las [place], tmese la temperatura y llame al profesional para informarlo sobre esto.   Observa unas pequeas manchas, una hemorragia vaginal o elimina cogulos. Notifique al profesional acerca de la cantidad y de cuntos apsitos est utilizando. Unas pequeas manchas de sangre son algo comn durante el Psychiatrist, especialmente despus de Sales promotion account executive.   Presenta un olor desagradable en la secrecin vaginal y observa un cambio en el color, de transparente a blanco.   Contina con las nuseas y no obtiene alivio de los remedios indicados. Vomita sangre o algo similar a la borra del caf.   [place] o sube ms de 900 g. en una semana, o segn lo indicado por el profesional que la asiste.   Observa que se le [employer], las manos, los pies o las piernas.   Ha estado expuesta a la rubola y no ha sufrido la enfermedad.   Ha estado expuesta a la quinta enfermedad o a la varicela.   Presenta dolor abdominal. Las molestias en el ligamento redondo son [name] causa no cancerosa (benigna) frecuente de dolor abdominal durante el embarazo. El profesional que la asiste deber evaluarla.   Presenta dolor de cabeza intenso que no se [place].   Presenta fiebre, diarrea, dolor al orinar o le falta la respiracin.   Presenta dificultad para ver, visin borrosa, o visin doble.   Sufre una cada, un accidente de trnsito o cualquier tipo de trauma.   Vive en un hogar en el que existe violencia fsica o mental.  Document Released: 06/12/2005 Document Revised: 08/22/2011 [facility] Patient Information 2012 [place], [place].

## 2012-03-06 NOTE — Progress Notes (Signed)
Pt feels well. No pain complaints today. Some swelling at the end of a long work day. 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 Taking prenatal vitamin. Frequent fetal movement. Obtained orange card.  Measuring smaller than dates today, though pt obese and difficult to measure. Will set up complete OB US.

## 2012-03-13 ENCOUNTER — Ambulatory Visit (HOSPITAL_COMMUNITY)
Admission: RE | Admit: 2012-03-13 | Discharge: 2012-03-13 | Disposition: A | Discharge status: Home / Self Care | Payer: Self-pay | Source: Ambulatory Visit | Attending: Family Medicine | Admitting: Family Medicine

## 2012-03-13 DIAGNOSIS — Z3689 Encounter for other specified antenatal screening: Secondary | ICD-10-CM | POA: Insufficient documentation

## 2012-03-13 DIAGNOSIS — Z3490 Encounter for supervision of normal pregnancy, unspecified, unspecified trimester: Secondary | ICD-10-CM

## 2012-03-17 NOTE — Progress Notes (Signed)
26 YO [id number] here at 26 and 6 weeks for routine prenatal visit.  No acute issues or concerns currently.  Supportive family.  Plan for CBC, HIV, RPR at 28 weeks Repeat glucola at 28 weeks.  Follow up with PCP in 2 weeks.  Labor red flags reviewed.

## 2012-04-09 ENCOUNTER — Ambulatory Visit (INDEPENDENT_AMBULATORY_CARE_PROVIDER_SITE_OTHER): Payer: Self-pay | Admitting: Family Medicine

## 2012-04-09 ENCOUNTER — Encounter: Payer: Self-pay | Admitting: Family Medicine

## 2012-04-09 DIAGNOSIS — Z3480 Encounter for supervision of other normal pregnancy, unspecified trimester: Secondary | ICD-10-CM

## 2012-04-09 NOTE — Patient Instructions (Addendum)
PLEASE PAGE DR Arijana Narayan WHEN PT ARRIVES ON 04/10/12 (EVEN IF DURING LUNCH HOUR)  You are doing well.  Please come back tomorrow to have your cervix checked. [facility] if you have any of the warning signs discussed. Please schedule an appt to come back and see me in 4 wks.   Vanetta Mulders - Systems analyst trimestre (Pregnancy - Third Trimester) El tercer trimestre del embarazo (los ltimos 3 meses) es el perodo de cambios ms rpidos que atraviesan usted y el beb. El aumento de peso es ms rpido. El beb alcanza un largo de aproximadamente 50 cm (20 pulgadas) y pesa entre 2,700 y 4,500 kg (6 a 10 libras). El beb gana ms tejido graso y ya est listo para la vida fuera del cuerpo de la [place]. Mientras estn en el interior, los bebs tienen perodos de sueo y vigilia, Warehouse manager y tienen hipo. Quizs sienta pequeas contracciones del tero. Este es el falso trabajo de [place]. Tambin se las conoce como contracciones de Braxton-H[place]. Es como una prctica del parto. Los problemas ms habituales de esta etapa del embarazo incluyen mayor dificultad para respirar, hinchazn de las manos y los pies por retencin de lquidos y la necesidad de Geographical information systems officer con ms frecuencia debido a que el tero y el beb presionan sobre la vejiga.  EXAMENES PRENATALES  Durante los [employer], deber seguir realizando pruebas de [place], segn avance el [place]. Estas pruebas se realizan para controlar su salud y la del beb. Tambin se realizan anlisis de sangre para [employer] niveles de [place]. La anemia (bajo nivel de hemoglobina) es frecuente durante el embarazo. Para prevenirla, se administran hierro y vitaminas. Tambin le harn nuevas pruebas para descartar la diabetes. Podrn repetirle algunas de las [employer] hicieron previamente.   En cada visita le medirn el tamao del tero. Es para asegurarse de que el beb se desarrolla correctamente.   Tambin en cada visita la  pesarn. Esto se realiza para asegurarse de que aumenta de peso al ritmo indicado y que usted y su beb evolucionan normalmente.   En algunas ocasiones se realiza una ecografa para confirmar el correcto desarrollo y evolucin del beb. Esta prueba se realiza con ondas sonoras inofensivas para el beb, de modo que el profesional pueda calcular con ms precisin la fecha del [place].   Discuta las posibilidades de la anestesia si necesita cesrea.  Algunas veces se realizan pruebas especializadas del lquido amnitico que rodea al beb. Esta prueba se denomina amniocentesis. El lquido amnitico se obtiene introduciendo una aguja en el abdomen (vientre). En ocasiones se lleva a cabo cerca del final del embarazo, si es Optician, dispensing. En este caso se realiza para asegurarse de que los pulmones del beb estn lo suficientemente maduros como para que pueda vivir fuera del tero. CAMBIOS QUE OCURREN EN EL TERCER TRIMESTRE DEL EMBARAZO Su organismo atravesar diferentes cambios durante el embarazo que varan de [name] persona a Educational psychologist. Converse con el profesional que la asiste acerca los cambios que usted note y que la preocupen.  Durante el ltimo trimestre probablemente sienta un aumento del apetito. Es normal tener "antojos" de Development worker, community. Esto vara de [name] persona a otra y de un embarazo a Therapist, art.   Podrn aparecer las primeras estras en las caderas, abdomen y [place]. Estos son cambios normales del cuerpo durante el [place]. No existen medicamentos ni ejercicios que puedan prevenir [employer].   El estreimiento puede tratarse con un laxante  o agregando fibra a su dieta. Beber grandes cantidades de lquidos, tomar fibras en forma de verduras, frutas y granos integrales es de [place].   Tambin es beneficioso practicar actividad fsica. Si ha sido una persona Engineer, mining, podr continuar con la [employer] de las actividades durante el mismo. Si ha sido [employer], puede ser  beneficioso que comience con un programa de ejercicios, Museum/gallery exhibitions officer. Consulte con el profesional que la asiste antes de comenzar un programa de ejercicios.   Evite el consumo de cigarrillos, el alcohol, los medicamentos no prescritos y las "drogas de la calle" durante el [place]. Estas sustancias qumicas afectan la formacin y el desarrollo del beb. Evite estas sustancias durante todo el embarazo para asegurar el nacimiento de un beb sano.   Dolor de espalda, venas varicosas y hemorroides podran aparecer o empeorar.   Los movimientos del beb pueden ser ms bruscos y aparecer ms a menudo.   Puede que note dificultades para respirar facilmente.   El ombligo podra salrsele hacia afuera.   Puede segregar un lquido amarillento (calostro) de las [place].   Puede segregar mucus con sangre. Esto normalmente ocurre unos [address] a una semana antes de que comience el [employer] de [place].  INSTRUCCIONES PARA EL CUIDADO DOMICILIARIO  La mayor parte de los cuidados que se aconsejan son los mismos que los indicados para las primeras etapas del Psychiatrist. Es importante que concurra a todas las citas con el profesional y siga sus instrucciones con Camera operator a los medicamentos que deba Chemical engineer, a la actividad fsica y a Psychologist, forensic.   Durante el embarazo debe obtener nutrientes para usted y para su beb. Consuma alimentos balanceados a intervalos regulares. Elija alimentos como carne, pescado, [place] y otros productos lcteos descremados, verduras, frutas, panes integrales y cereales. El Equities trader cul es el aumento de peso ideal.   Las relaciones sexuales pueden continuarse hasta casi el final del embarazo, si no se presentan otros problemas como prdida prematura (antes de tiempo) de lquido amnitico, hemorragia vaginal o dolor abdominal (en el vientre).   Realice [employer], si no tiene restricciones. Consulte con el profesional que la asiste si no sabe con  certeza si determinados ejercicios son seguros. El mayor aumento de peso se produce [employer] ltimos trimestres del [place].   Haga reposo con frecuencia, con las piernas elevadas, o segn lo necesite para evitar los calambres y el dolor de cintura.   Use un buen sostn o como los que se usan para hacer deportes para Paramedic la sensibilidad de las [place]. Tambin puede serle til si lo [place] mientras duerme. Si pierde Product manager, podr [employer].   No utilice la baera con agua caliente, baos turcos y saunas.   Colquese el cinturn de seguridad cuando conduzca. Este la proteger a usted y al beb en caso de accidente.   Evite comer carne cruda y el contacto con los utensilios y desperdicios de los gatos. Estos elementos contienen grmenes que pueden causar defectos de nacimiento en el beb.   Es fcil perder algo de orina durante el [place]. Apretar y Chief Operating Officer los msculos de la pelvis la ayudar con este problema. Practique detener la miccin cuando est en el bao. Estos son los mismos msculos que Development worker, international aid. Son [employer] mismos msculos que utiliza cuando trata de [employer] System gases. Puede practicar apretando estos msculos [employer], y repetir esto tres veces por da aproximadamente. Una vez que  conozca qu msculos debe contraer, no realice estos ejercicios durante la miccin. Puede favorecerle una infeccin si la orina vuelve hacia atrs.   Pida ayuda si tiene necesidades econmicas, de asesoramiento o nutricionales durante el [place]. El profesional podr ayudarla con respecto a estas necesidades, o derivarla a otros especialistas.   Practique la ida [employer] [facility] a modo de [place].   Tome clases prenatales junto con su pareja para comprender, practicar y hacer preguntas acerca del Aleen Campi de parto y el nacimiento.   Prepare la habitacin del beb.   No viaje fuera de la ciudad a menos que sea absolutamente necesario y con el consejo del mdico.     Use slo zapatos bajos sin taco para tener un mejor equilibrio y prevenir cadas.  EL CONSUMO DE MEDICAMENTOS Y DROGAS DURANTE EL EMBARAZO  Contine tomando las vitaminas apropiadas para esta etapa tal como se le indic. Las vitaminas deben contener un miligramo de cido flico y deben suplementarse con hierro. Guarde todas las vitaminas fuera del alcance de los nios. La ingestin de slo un par de vitaminas o comprimidos que contengan hierro pueden ocasionar la [employer] en un beb o en un nio pequeo.   Evite el uso de [place], inclusive los de venta [place], que no hayan sido prescritos o indicados por el profesional que la asiste. Algunos medicamentos pueden causar problemas fsicos al beb. Utilice los medicamentos de venta libre o de prescripcin para Chief Technology Officer, Environmental health practitioner o la [place], segn se lo indique el profesional que lo asiste. No utilice aspirina, ibuprofeno (Motrin, Advil, Nuprin) o naproxeno (Aleve) a menos que el profesional la autorice.   El alcohol se asocia a cierto nmero de defectos del nacimiento, incluido el sndrome de alcoholismo fetal. Debe evitar el consumo de alcohol en cualquiera de sus formas. El cigarrillo causa nacimientos prematuros y bebs de bajo peso al nacer. Las drogas de la calle son muy nocivas para el beb y estn absolutamente prohibidas. Un beb que nace de [employer], ser adicto al nacer. Ese beb tendr los mismos sntomas de abstinencia que un adulto.   Infrmele al profesional si consume alguna droga.  SOLICITE ATENCIN MDICA SI: Tiene alguna preocupacin Academic librarian. Es mejor que llame para formular las preguntas si no puede esperar hasta la prxima visita, que sentirse preocupada por ellas.  DECISIONES ACERCA DE LA CIRCUNCISIN Usted puede saber o no cul es el sexo de su beb. Si es un varn, ste es el momento de pensar acerca de la circuncisin. La circuncisin es la extirpacin del prepucio. Esta es la piel que cubre el  extremo sensible del pene. No hay un motivo mdico que lo justifique. Generalmente la decisin se toma segn lo que sea popular en ese momento, o se basa en creencias religiosas. Podr conversar estos temas con el profesional que la asiste. SOLICITE ATENCIN MDICA DE INMEDIATO SI:  La temperatura oral se eleva sin motivo por encima de 102 F (38.9 C) o segn le indique el profesional que la asiste.   Tiene una prdida de lquido por la vagina (canal de parto). Si sospecha una ruptura de las [place], tmese la temperatura y llame al profesional para informarlo sobre esto.   Observa unas pequeas manchas, una hemorragia vaginal o elimina cogulos. Avsele al profesional acerca de la cantidad y de cuntos apsitos est utilizando.   Presenta un olor desagradable en la secrecin vaginal y observa un cambio en el color, de transparente a blanco.   Ha vomitado  durante ms de 24 horas.   Presenta escalofros o fiebre.   Comienza a sentir falta de aire.   Siente ardor al Beatrix Shipper.   [place] o sube ms de 900 g (ms de 2 libras), o segn lo indicado por el profesional que la asiste. Observa que sbitamente se le hinchan el rostro, las manos, los pies o las piernas.   Presenta dolor abdominal. Las molestias en el ligamento redondo son [name] causa benigna (no cancerosa) frecuente de Engineer, mining abdominal durante el Psychiatrist, pero el profesional que la asiste deber evaluarlo.   Presenta dolor de cabeza intenso que no se [place].   Si no siente los movimientos del beb durante ms de tres horas. Si piensa que el beb no se mueve tanto como lo haca habitualmente, coma algo que Psychologist, clinical y [employer] lado izquierdo durante [place]. El beb debe moverse al menos 4  5 veces por hora. Comunquese inmediatamente si el beb se mueve menos que lo indicado.   Se cae, se ve involucrada en un accidente automovilstico o sufre algn tipo de traumatismo.   En su hogar hay violencia mental o fsica.    Document Released: 06/12/2005 Document Revised: 08/22/2011 [facility] Patient Information 2012 [place], [place].

## 2012-04-09 NOTE — Progress Notes (Signed)
Doing well.  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 Occasional vaginal pain when baby moves since that time. Denies any contractions or vaginal discharge. Vigorous fetal movement. Pt to return tomorrow (04/10/12) for cervical check as pt cervix today at 1cm and fairly firm w/ 10% effacement.  Reviewed [place] report and new due date w/ pt.  Discussed labor signs and reasons to go to MAU (discussion w/ pt utilizing interpretor. Pt w/ good understanding of concerning symptoms)

## 2012-04-09 NOTE — Progress Notes (Signed)
111

## 2012-04-10 ENCOUNTER — Encounter (HOSPITAL_COMMUNITY): Payer: Self-pay | Admitting: *Deleted

## 2012-04-10 ENCOUNTER — Ambulatory Visit (INDEPENDENT_AMBULATORY_CARE_PROVIDER_SITE_OTHER): Payer: Self-pay | Admitting: Family Medicine

## 2012-04-10 ENCOUNTER — Inpatient Hospital Stay (HOSPITAL_COMMUNITY)
Admission: AD | Admit: 2012-04-10 | Discharge: 2012-04-10 | Disposition: A | Discharge status: Home / Self Care | Payer: Self-pay | Source: Ambulatory Visit | Attending: Obstetrics & Gynecology | Admitting: Obstetrics & Gynecology

## 2012-04-10 VITALS — BP 116/73

## 2012-04-10 DIAGNOSIS — O368390 Maternal care for abnormalities of the fetal heart rate or rhythm, unspecified trimester, not applicable or unspecified: Secondary | ICD-10-CM | POA: Insufficient documentation

## 2012-04-10 DIAGNOSIS — Z3480 Encounter for supervision of other normal pregnancy, unspecified trimester: Secondary | ICD-10-CM

## 2012-04-10 DIAGNOSIS — W19XXXA Unspecified fall, initial encounter: Secondary | ICD-10-CM | POA: Insufficient documentation

## 2012-04-10 DIAGNOSIS — Z3490 Encounter for supervision of normal pregnancy, unspecified, unspecified trimester: Secondary | ICD-10-CM

## 2012-04-10 HISTORY — DX: Cerebral infarction, unspecified: I63.9

## 2012-04-10 NOTE — Progress Notes (Signed)
Written and verbal d/c instructions given and understanding voiced. Nettie Elm, interpreter helped.

## 2012-04-10 NOTE — Progress Notes (Signed)
Pt in for cervical check after fall at work and clinic visit 1 day ago. Cervix unchanged from previous check. Fetal movement appropriate. Still w/ intermittent vaginal pain but no discharge/LOF No contractions. NST today at womens. Pt aware and nursing to call w/ appt time Pt aware of reasons for emergent evaluation and signs of labor.

## 2012-04-10 NOTE — MAU Note (Signed)
Dr Adrian Blackwater reviewed  EFM strip. Pt seen at Dr Satira Sark earlier today.

## 2012-04-10 NOTE — MAU Note (Signed)
I fell Wednesday. I did not hit my tummy but fell to a sitting position. Good FM. I have vaginal pain only when the baby moves. I saw alittle bit of spotting one time today at 1630. I saw the doctor today and then went back to work. I think he wants me to have an u/s or something to check out the baby and be sure everything is ok

## 2012-04-10 NOTE — Progress Notes (Signed)
Reports good FM

## 2012-04-10 NOTE — MAU Provider Note (Signed)
Kaitlin Koch [birth year]  Patient here for NST - was seen in office earlier.  I reviewed the entire strip: Category 1 tracing with accelerations.  No contractions seen.  Levie Heritage, DO 04/10/2012 10:57 PM

## 2012-04-16 ENCOUNTER — Encounter (HOSPITAL_COMMUNITY): Payer: Self-pay | Admitting: Emergency Medicine

## 2012-04-16 ENCOUNTER — Emergency Department (HOSPITAL_COMMUNITY)
Admission: EM | Admit: 2012-04-16 | Discharge: 2012-04-16 | Disposition: A | Discharge status: Home / Self Care | Payer: Self-pay | Attending: Emergency Medicine | Admitting: Emergency Medicine

## 2012-04-16 DIAGNOSIS — O99891 Other specified diseases and conditions complicating pregnancy: Secondary | ICD-10-CM | POA: Insufficient documentation

## 2012-04-16 DIAGNOSIS — Z8673 Personal history of transient ischemic attack (TIA), and cerebral infarction without residual deficits: Secondary | ICD-10-CM | POA: Insufficient documentation

## 2012-04-16 DIAGNOSIS — R21 Rash and other nonspecific skin eruption: Secondary | ICD-10-CM | POA: Insufficient documentation

## 2012-04-16 MED ORDER — HYDROCORTISONE 1 % EX CREA: TOPICAL_CREAM | CUTANEOUS | Status: DC

## 2012-04-16 MED ORDER — CETIRIZINE HCL 10 MG PO TABS: 10.0000 mg | ORAL_TABLET | Freq: Every day | ORAL | Status: DC

## 2012-04-16 NOTE — ED Notes (Signed)
Pt speaks spanish -spanish speaking RN at bedside translating---Reports rash to legs and arms since Monday- started new PNV on Monday; in beginning of pregnancy she took PNV and had similar rxn with full body rashes

## 2012-04-16 NOTE — ED Notes (Signed)
Called Spanish Int. Olegario Messier #[id number] to assist in reviewing discharge instructions.  Patient voiced understanding.

## 2012-04-16 NOTE — ED Provider Notes (Signed)
History   This chart was scribed for Kaitlin Chick, MD by Shari Heritage. The patient was seen in room TR05C/TR05C. Patient's care was started at 1807.     CSN: [id number]  Arrival date & time 04/16/12  1807   First MD Initiated Contact with Patient 04/16/12 1851      Chief Complaint  Patient presents with  . Rash    (Consider location/radiation/quality/duration/timing/severity/associated sxs/prior treatment) Patient is a 26 y.o. female presenting with rash. The history is provided by the patient. No language interpreter was used.  Rash  This is a new problem. The current episode started more than 2 days ago. The problem is associated with an unknown factor. There has been no fever. The rash is present on the left arm, right arm, right upper leg, right lower leg, left upper leg and left lower leg. The patient is experiencing no pain. Associated symptoms include itching. She has tried nothing for the symptoms.    Kaitlin Koch is a 26 y.o. female who presents to the Emergency Department complaining of a rash to the arms and legs bilaterally with associated itchiness onset 3 days ago. There is no SOB or swelling of the mouth. Patient says that in February she had a similar skin reaction to her prenatal vitamins. The rash was resolved without any specific treatment. Patient switched to a different brand of prenatal vitamins and has been taking them for the past 3 months with no rash until 3 days ago. She denies any exposure to new soap or detergent. Patient says that no one else at home has a rash. Patient is 7 months pregnant. Patient has a medical history of stroke and surgical history of brain surgery. Patient has never smoked.   Past Medical History  Diagnosis Date  . Stroke     Past Surgical History  Procedure Date  . No past surgeries   . Brain surgery     Family History  Problem Relation Age of Onset  . Depression Neg Hx   . Other Neg Hx     History  Substance Use  Topics  . Smoking status: Never Smoker   . Smokeless tobacco: Never Used  . Alcohol Use: No    OB History    Grav Para Term Preterm Abortions TAB SAB Ect Mult Living   4 2 2  1  1   2       Review of Systems  Skin: Positive for itching and rash.  All other systems reviewed and are negative.    Allergies  Review of patient's allergies indicates no known allergies.  Home Medications   Current Outpatient Rx  Name Route Sig Dispense Refill  . PRENATAL MULTIVITAMIN CH Oral Take 1 tablet by mouth daily.    Marland Kitchen CETIRIZINE HCL 10 MG PO TABS Oral Take 1 tablet (10 mg total) by mouth daily. 30 tablet 0  . HYDROCORTISONE 1 % EX CREA  Apply to affected area 2 times daily 15 g 0    BP 91/60  Pulse 63  Temp 96.9 F (36.1 C) (Oral)  Resp 18  SpO2 97%  LMP 08/20/2011  Physical Exam  Nursing note and vitals reviewed. Constitutional: She is oriented to person, place, and time. She appears well-developed and well-nourished. No distress.  HENT:  Head: Normocephalic and atraumatic.  Eyes: EOM are normal. Pupils are equal, round, and reactive to light.  Neck: Neck supple. No tracheal deviation present.  Abdominal: Soft. She exhibits no distension.  Musculoskeletal: Normal range  of motion. She exhibits no edema.  Neurological: She is alert and oriented to person, place, and time. No sensory deficit.  Skin: Skin is warm and dry. Rash noted.       Diffuse macular, papular rash on bilateral upper and lower extremities with scattered excoriations on bilateral lower extremities. No involvement of palms of hands or lips. No lip or tongue swelling.  Psychiatric: She has a normal mood and affect. Her behavior is normal.    ED Course  Procedures (including critical care time) DIAGNOSTIC STUDIES: Oxygen Saturation is 98% on room air, normal by my interpretation.    COORDINATION OF CARE: 7:12pm- Patient informed of current plan for treatment and evaluation and agrees with plan at this time.  Will discharge patient with prescriptions for hydrocortisone cream and Zyrtec 10 mg. Patient can continue to take prenatal vitamins as it is unlikely that they are the source of the rash.    Labs Reviewed - No data to display No results found.   1. Rash       MDM  Pt approx 7 months pregnant with itchy rash over arms and legs.  Appearance c/w small hives.  No lip or tongue swelling or difficulty breathing.  Per Uptodate, zyrtec is a safer antihistamine and hydrocortisone given for rash- suspect urticaria of pregnancy, but other allergic reaction is not eliminated.  Discharged with strict return precautions.  Pt agreeable with plan.      I personally performed the services described in this documentation, which was scribed in my presence. The recorded information has been reviewed and considered.    Kaitlin Chick, MD 04/17/12 [phone]

## 2012-04-17 ENCOUNTER — Ambulatory Visit: Payer: Self-pay

## 2012-05-12 ENCOUNTER — Encounter: Payer: Self-pay | Admitting: Family Medicine

## 2012-05-12 ENCOUNTER — Ambulatory Visit (INDEPENDENT_AMBULATORY_CARE_PROVIDER_SITE_OTHER): Payer: Self-pay | Admitting: Family Medicine

## 2012-05-12 DIAGNOSIS — Z3480 Encounter for supervision of other normal pregnancy, unspecified trimester: Secondary | ICD-10-CM

## 2012-05-12 NOTE — Progress Notes (Signed)
No further falls.  No complaints Vifgorous fetal movement Denies vaginal bleeding, discharge, LOF, contractions severe abdominal pain, HA, LE edema 1 wk f/u.  Labor precautions reviewed OB clinic in 2 wks

## 2012-05-12 NOTE — Patient Instructions (Addendum)
Embarazo - Tercer trimestre (Pregnancy - Third Trimester) El tercer trimestre del embarazo (los ltimos 3 meses) es el perodo de cambios ms rpidos que atraviesan usted y el beb. El aumento de peso es ms rpido. El beb alcanza un largo de aproximadamente 50 cm (20 pulgadas) y pesa entre 2,700 y 4,500 kg (6 a 10 libras). El beb gana ms tejido graso y ya est listo para la vida fuera del cuerpo de la madre. Mientras estn en el interior, los bebs tienen perodos de sueo y vigilia, succionan el pulgar y tienen hipo. Quizs sienta pequeas contracciones del tero. Este es el falso trabajo de parto. Tambin se las conoce como contracciones de Braxton-Hicks. Es como una prctica del parto. Los problemas ms habituales de esta etapa del embarazo incluyen mayor dificultad para respirar, hinchazn de las manos y los pies por retencin de lquidos y la necesidad de orinar con ms frecuencia debido a que el tero y el beb presionan sobre la vejiga.  EXAMENES PRENATALES  Durante los exmenes prenatales, deber seguir realizando pruebas de sangre, segn avance el embarazo. Estas pruebas se realizan para controlar su salud y la del beb. Tambin se realizan anlisis de sangre para conocer los niveles de hemoglobina. La anemia (bajo nivel de hemoglobina) es frecuente durante el embarazo. Para prevenirla, se administran hierro y vitaminas. Tambin le harn nuevas pruebas para descartar la diabetes. Podrn repetirle algunas de las pruebas que le hicieron previamente.   En cada visita le medirn el tamao del tero. Es para asegurarse de que el beb se desarrolla correctamente.   Tambin en cada visita la pesarn. Esto se realiza para asegurarse de que aumenta de peso al ritmo indicado y que usted y su beb evolucionan normalmente.   En algunas ocasiones se realiza una ecografa para confirmar el correcto desarrollo y evolucin del beb. Esta prueba se realiza con ondas sonoras inofensivas para el beb, de modo  que el profesional pueda calcular con ms precisin la fecha del parto.   Discuta las posibilidades de la anestesia si necesita cesrea.  Algunas veces se realizan pruebas especializadas del lquido amnitico que rodea al beb. Esta prueba se denomina amniocentesis. El lquido amnitico se obtiene introduciendo una aguja en el abdomen (vientre). En ocasiones se lleva a cabo cerca del final del embarazo, si es necesario adelantar el parto. En este caso se realiza para asegurarse de que los pulmones del beb estn lo suficientemente maduros como para que pueda vivir fuera del tero. CAMBIOS QUE OCURREN EN EL TERCER TRIMESTRE DEL EMBARAZO Su organismo atravesar diferentes cambios durante el embarazo que varan de una persona a otra. Converse con el profesional que la asiste acerca los cambios que usted note y que la preocupen.  Durante el ltimo trimestre probablemente sienta un aumento del apetito. Es normal tener "antojos" de ciertas comidas. Esto vara de una persona a otra y de un embarazo a otro.   Podrn aparecer las primeras estras en las caderas, abdomen y mamas. Estos son cambios normales del cuerpo durante el embarazo. No existen medicamentos ni ejercicios que puedan prevenir estos cambios.   El estreimiento puede tratarse con un laxante o agregando fibra a su dieta. Beber grandes cantidades de lquidos, tomar fibras en forma de verduras, frutas y granos integrales es de gran ayuda.   Tambin es beneficioso practicar actividad fsica. Si ha sido una persona activa hasta el embarazo, podr continuar con la mayora de las actividades durante el mismo. Si ha sido menos activa, puede ser beneficioso   que comience con un programa de ejercicios, como realizar caminatas. Consulte con el profesional que la asiste antes de comenzar un programa de ejercicios.   Evite el consumo de cigarrillos, el alcohol, los medicamentos no prescritos y las "drogas de la calle" durante el embarazo. Estas sustancias  qumicas afectan la formacin y el desarrollo del beb. Evite estas sustancias durante todo el embarazo para asegurar el nacimiento de un beb sano.   Dolor de espalda, venas varicosas y hemorroides podran aparecer o empeorar.   Los movimientos del beb pueden ser ms bruscos y aparecer ms a menudo.   Puede que note dificultades para respirar facilmente.   El ombligo podra salrsele hacia afuera.   Puede segregar un lquido amarillento (calostro) de las mamas.   Puede segregar mucus con sangre. Esto normalmente ocurre unos pocos das a una semana antes de que comience el trabajo de parto.  INSTRUCCIONES PARA EL CUIDADO DOMICILIARIO  La mayor parte de los cuidados que se aconsejan son los mismos que los indicados para las primeras etapas del embarazo. Es importante que concurra a todas las citas con el profesional y siga sus instrucciones con respecto a los medicamentos que deba utilizar, a la actividad fsica y a la dieta.   Durante el embarazo debe obtener nutrientes para usted y para su beb. Consuma alimentos balanceados a intervalos regulares. Elija alimentos como carne, pescado, leche y otros productos lcteos descremados, verduras, frutas, panes integrales y cereales. El profesional le informar cul es el aumento de peso ideal.   Las relaciones sexuales pueden continuarse hasta casi el final del embarazo, si no se presentan otros problemas como prdida prematura (antes de tiempo) de lquido amnitico, hemorragia vaginal o dolor abdominal (en el vientre).   Realice actividad fsica todos los das, si no tiene restricciones. Consulte con el profesional que la asiste si no sabe con certeza si determinados ejercicios son seguros. El mayor aumento de peso se produce en los dos ltimos trimestres del embarazo.   Haga reposo con frecuencia, con las piernas elevadas, o segn lo necesite para evitar los calambres y el dolor de cintura.   Use un buen sostn o como los que se usan para hacer  deportes para aliviar la sensibilidad de las mamas. Tambin puede serle til si lo usa mientras duerme. Si pierde calostro, podr utilizar apsitos en el sostn.   No utilice la baera con agua caliente, baos turcos y saunas.   Colquese el cinturn de seguridad cuando conduzca. Este la proteger a usted y al beb en caso de accidente.   Evite comer carne cruda y el contacto con los utensilios y desperdicios de los gatos. Estos elementos contienen grmenes que pueden causar defectos de nacimiento en el beb.   Es fcil perder algo de orina durante el embarazo. Apretar y fortalecer los msculos de la pelvis la ayudar con este problema. Practique detener la miccin cuando est en el bao. Estos son los mismos msculos que necesita fortalecer. Son tambin los mismos msculos que utiliza cuando trata de evitar los gases. Puede practicar apretando estos msculos diez veces, y repetir esto tres veces por da aproximadamente. Una vez que conozca qu msculos debe contraer, no realice estos ejercicios durante la miccin. Puede favorecerle una infeccin si la orina vuelve hacia atrs.   Pida ayuda si tiene necesidades econmicas, de asesoramiento o nutricionales durante el embarazo. El profesional podr ayudarla con respecto a estas necesidades, o derivarla a otros especialistas.   Practique la ida hasta el hospital a modo   de prueba.   Tome clases prenatales junto con su pareja para comprender, practicar y hacer preguntas acerca del trabajo de parto y el nacimiento.   Prepare la habitacin del beb.   No viaje fuera de la ciudad a menos que sea absolutamente necesario y con el consejo del mdico.   Use slo zapatos bajos sin taco para tener un mejor equilibrio y prevenir cadas.  EL CONSUMO DE MEDICAMENTOS Y DROGAS DURANTE EL EMBARAZO  Contine tomando las vitaminas apropiadas para esta etapa tal como se le indic. Las vitaminas deben contener un miligramo de cido flico y deben suplementarse con  hierro. Guarde todas las vitaminas fuera del alcance de los nios. La ingestin de slo un par de vitaminas o comprimidos que contengan hierro pueden ocasionar la muerte en un beb o en un nio pequeo.   Evite el uso de medicamentos, inclusive los de venta libre, que no hayan sido prescritos o indicados por el profesional que la asiste. Algunos medicamentos pueden causar problemas fsicos al beb. Utilice los medicamentos de venta libre o de prescripcin para el dolor, el malestar o la fiebre, segn se lo indique el profesional que lo asiste. No utilice aspirina, ibuprofeno (Motrin, Advil, Nuprin) o naproxeno (Aleve) a menos que el profesional la autorice.   El alcohol se asocia a cierto nmero de defectos del nacimiento, incluido el sndrome de alcoholismo fetal. Debe evitar el consumo de alcohol en cualquiera de sus formas. El cigarrillo causa nacimientos prematuros y bebs de bajo peso al nacer. Las drogas de la calle son muy nocivas para el beb y estn absolutamente prohibidas. Un beb que nace de una madre adicta, ser adicto al nacer. Ese beb tendr los mismos sntomas de abstinencia que un adulto.   Infrmele al profesional si consume alguna droga.  SOLICITE ATENCIN MDICA SI: Tiene alguna preocupacin durante el embarazo. Es mejor que llame para formular las preguntas si no puede esperar hasta la prxima visita, que sentirse preocupada por ellas.  DECISIONES ACERCA DE LA CIRCUNCISIN Usted puede saber o no cul es el sexo de su beb. Si es un varn, ste es el momento de pensar acerca de la circuncisin. La circuncisin es la extirpacin del prepucio. Esta es la piel que cubre el extremo sensible del pene. No hay un motivo mdico que lo justifique. Generalmente la decisin se toma segn lo que sea popular en ese momento, o se basa en creencias religiosas. Podr conversar estos temas con el profesional que la asiste. SOLICITE ATENCIN MDICA DE INMEDIATO SI:  La temperatura oral se eleva  sin motivo por encima de 102 F (38.9 C) o segn le indique el profesional que la asiste.   Tiene una prdida de lquido por la vagina (canal de parto). Si sospecha una ruptura de las membranas, tmese la temperatura y llame al profesional para informarlo sobre esto.   Observa unas pequeas manchas, una hemorragia vaginal o elimina cogulos. Avsele al profesional acerca de la cantidad y de cuntos apsitos est utilizando.   Presenta un olor desagradable en la secrecin vaginal y observa un cambio en el color, de transparente a blanco.   Ha vomitado durante ms de 24 horas.   Presenta escalofros o fiebre.   Comienza a sentir falta de aire.   Siente ardor al orinar.   [place] o sube ms de 900 g (ms de 2 libras), o segn lo indicado por el profesional que la asiste. Observa que sbitamente se le hinchan el rostro, las manos, los pies o las   piernas.   Presenta dolor abdominal. Las molestias en el ligamento redondo son una causa benigna (no cancerosa) frecuente de dolor abdominal durante el embarazo, pero el profesional que la asiste deber evaluarlo.   Presenta dolor de cabeza intenso que no se alivia.   Si no siente los movimientos del beb durante ms de tres horas. Si piensa que el beb no se mueve tanto como lo haca habitualmente, coma algo que contenga azcar y recustese sobre el lado izquierdo durante una hora. El beb debe moverse al menos 4  5 veces por hora. Comunquese inmediatamente si el beb se mueve menos que lo indicado.   Se cae, se ve involucrada en un accidente automovilstico o sufre algn tipo de traumatismo.   En su hogar hay violencia mental o fsica.  Document Released: 06/12/2005 Document Revised: 08/22/2011 [employer] Patient Information 2012 [employer]. 

## 2012-05-20 ENCOUNTER — Encounter: Payer: Self-pay | Admitting: Family Medicine

## 2012-05-20 ENCOUNTER — Ambulatory Visit (INDEPENDENT_AMBULATORY_CARE_PROVIDER_SITE_OTHER): Payer: Self-pay | Admitting: Family Medicine

## 2012-05-20 VITALS — BP 110/71 | Temp 97.8°F | Wt 170.0 lb

## 2012-05-20 DIAGNOSIS — Z3480 Encounter for supervision of other normal pregnancy, unspecified trimester: Secondary | ICD-10-CM

## 2012-05-20 LAB — OB RESULTS CONSOLE GC/CHLAMYDIA: Chlamydia: NEGATIVE

## 2012-05-20 NOTE — Patient Instructions (Signed)
Thank you for coming in today. You are doing doing great Come back in 1 week on 05/28/12 at 3pm   Prevencin de parto prematuro (Preventing Preterm Labor) Un parto prematuro ocurre cuando la mujer embarazada tiene contracciones uterinas que causan la apertura, el acortamiento y el afinamiento del cuello del tero, antes de las 37 semanas de [place]. Tendr contracciones regulares cada 2 a 3 minutos. Esto generalmente causa molestias o dolor. CUIDADOS EN EL HOGAR  Consuma una dieta saludable.   [employer] Johnson las vitaminas segn le haya indicado el mdico.   Beba una cantidad de lquido suficiente como para Pharmacologist la orina de tono claro o color amarillo plido todos [place].   Descanse y duerma.   No tenga relaciones sexuales si tiene un parto prematuro o alto riesgo de tenerlo.   Siga las instrucciones del mdico acerca de su [place], los medicamentos y los exmenes.   Evite el estrs.   Evite los esfuerzos extenuantes o la actividad fsica extensa.   No fume.  SOLICITE AYUDA DE INMEDIATO SI:   Tiene contracciones.   Siente dolor abdominal.   Tiene sangrado que proviene de la vagina.   Siente dolor al ConocoPhillips.   Observa una secrecin anormal que proviene de la vagina.   Tiene una temperatura oral de ms de 102 F (38.9 C).  ASEGRESE DE QUE:  Comprende estas instrucciones.   Controlar su enfermedad.   Solicitar ayuda si no mejora o si empeora.  Document Released: 10/05/2010 Document Revised: 08/22/2011 [facility] Patient Information 2012 [place], [place].Kaitlin Koch - Systems analyst trimestre (Pregnancy - Third Trimester) El tercer trimestre del embarazo (los ltimos 3 meses) es el perodo de cambios ms rpidos que atraviesan usted y el beb. El aumento de peso es ms rpido. El beb alcanza un largo de aproximadamente 50 cm (20 pulgadas) y pesa entre 2,700 y 4,500 kg (6 a 10 libras). El beb gana ms tejido graso y ya est listo para la vida fuera del cuerpo de la [place]. Mientras  estn en el interior, los bebs tienen perodos de sueo y vigilia, Warehouse manager y tienen hipo. Quizs sienta pequeas contracciones del tero. Este es el falso trabajo de [place]. Tambin se las conoce como contracciones de [place]. Es como una prctica del parto. Los problemas ms habituales de esta etapa del embarazo incluyen mayor dificultad para respirar, hinchazn de las manos y los pies por retencin de lquidos y la necesidad de Geographical information systems officer con ms frecuencia debido a que el tero y el beb presionan sobre la vejiga.  EXAMENES PRENATALES  Durante los Manpower Inc, deber seguir realizando pruebas de [place], segn avance el [place]. Estas pruebas se realizan para controlar su salud y la del beb. Tambin se realizan anlisis de sangre para [employer] niveles de [place]. La anemia (bajo nivel de hemoglobina) es frecuente durante el embarazo. Para prevenirla, se administran hierro y vitaminas. Tambin le harn nuevas pruebas para descartar la diabetes. Podrn repetirle algunas de las [employer] Enterprises hicieron previamente.   En cada visita le medirn el tamao del tero. Es para asegurarse de que el beb se desarrolla correctamente.   Tambin en cada visita la pesarn. Esto se realiza para asegurarse de que aumenta de peso al ritmo indicado y que usted y su beb evolucionan normalmente.   En algunas ocasiones se realiza una ecografa para confirmar el correcto desarrollo y evolucin del beb. Esta prueba se realiza con ondas sonoras inofensivas para el beb, de modo que el profesional pueda calcular con ms  precisin la fecha del parto.   Discuta las posibilidades de la anestesia si necesita cesrea.  Algunas veces se realizan pruebas especializadas del lquido amnitico que rodea al beb. Esta prueba se denomina amniocentesis. El lquido amnitico se obtiene introduciendo una aguja en el abdomen (vientre). En ocasiones se lleva a cabo cerca del final del embarazo, si es Engineer, site. En este caso se realiza para asegurarse de que los pulmones del beb estn lo suficientemente maduros como para que pueda vivir fuera del tero. CAMBIOS QUE OCURREN EN EL TERCER TRIMESTRE DEL EMBARAZO Su organismo atravesar diferentes cambios durante el embarazo que varan de [name] persona a Educational psychologist. Converse con el profesional que la asiste acerca los cambios que usted note y que la preocupen.  Durante el ltimo trimestre probablemente sienta un aumento del apetito. Es normal tener "antojos" de Development worker, community. Esto vara de [name] persona a otra y de un embarazo a Therapist, art.   Podrn aparecer las primeras estras en las caderas, abdomen y [place]. Estos son cambios normales del cuerpo durante el [place]. No existen medicamentos ni ejercicios que puedan prevenir [employer].   El estreimiento puede tratarse con un laxante o agregando fibra a su dieta. Beber grandes cantidades de lquidos, tomar fibras en forma de verduras, frutas y granos integrales es de [place].   Tambin es beneficioso practicar actividad fsica. Si ha sido una persona Engineer, mining, podr continuar con la [employer]son de las actividades durante el mismo. Si ha sido [employer], puede ser beneficioso que comience con un programa de ejercicios, Museum/gallery exhibitions officer. Consulte con el profesional que la asiste antes de comenzar un programa de ejercicios.   Evite el consumo de cigarrillos, el alcohol, los medicamentos no prescritos y las "drogas de la calle" durante el [place]. Estas sustancias qumicas afectan la formacin y el desarrollo del beb. Evite estas sustancias durante todo el embarazo para asegurar el nacimiento de un beb sano.   Dolor de espalda, venas varicosas y hemorroides podran aparecer o empeorar.   Los movimientos del beb pueden ser ms bruscos y aparecer ms a menudo.   Puede que note dificultades para respirar facilmente.   El ombligo podra salrsele hacia afuera.   Puede  segregar un lquido amarillento (calostro) de las [place].   Puede segregar mucus con sangre. Esto normalmente ocurre unos [address] a una semana antes de que comience el [place] de [place].  INSTRUCCIONES PARA EL CUIDADO DOMICILIARIO  La mayor parte de los cuidados que se aconsejan son los mismos que los indicados para las primeras etapas del Psychiatrist. Es importante que concurra a todas las citas con el profesional y siga sus instrucciones con Camera operator a los medicamentos que deba Chemical engineer, a la actividad fsica y a Psychologist, forensic.   Durante el embarazo debe obtener nutrientes para usted y para su beb. Consuma alimentos balanceados a intervalos regulares. Elija alimentos como carne, pescado, [place] y otros productos lcteos descremados, verduras, frutas, panes integrales y cereales. El Equities trader cul es el aumento de peso ideal.   Las relaciones sexuales pueden continuarse hasta casi el final del embarazo, si no se presentan otros problemas como prdida prematura (antes de tiempo) de lquido amnitico, hemorragia vaginal o dolor abdominal (en el vientre).   Realice Tes[employer] Corporation, si no tiene restricciones. Consulte con el profesional que la asiste si no sabe con certeza si determinados ejercicios son seguros. El mayor aumento de peso se produce Foot [employer]ocker ltimos  trimestres del Psychiatrist.   Haga reposo con frecuencia, con las piernas elevadas, o segn lo necesite para evitar los calambres y el dolor de cintura.   Use un buen sostn o como los que se usan para hacer deportes para Paramedic la sensibilidad de las [place]. Tambin puede serle til si lo [place] mientras duerme. Si pierde Product manager, podr [employer]in.   No utilice la baera con agua caliente, baos turcos y saunas.   Colquese el cinturn de seguridad cuando conduzca. Este la proteger a usted y al beb en caso de accidente.   Evite comer carne cruda y el contacto con los utensilios y desperdicios de  los gatos. Estos elementos contienen grmenes que pueden causar defectos de nacimiento en el beb.   Es fcil perder algo de orina durante el [place]. Apretar y Chief Operating Officer los msculos de la pelvis la ayudar con este problema. Practique detener la miccin cuando est en el bao. Estos son los mismos msculos que Development worker, international aid. Son [employer] Partners mismos msculos que utiliza cuando trata de [employer]der System gases. Puede practicar apretando estos msculos WellPoint, y repetir esto tres veces por da aproximadamente. Una vez que conozca qu msculos debe contraer, no realice estos ejercicios durante la miccin. Puede favorecerle una infeccin si la orina vuelve hacia atrs.   Pida ayuda si tiene necesidades econmicas, de asesoramiento o nutricionales durante el [place]. El profesional podr ayudarla con respecto a estas necesidades, o derivarla a otros especialistas.   Practique la ida [employer] hospital a modo de [place].   Tome clases prenatales junto con su pareja para comprender, practicar y hacer preguntas acerca del Aleen Campi de parto y el nacimiento.   Prepare la habitacin del beb.   No viaje fuera de la ciudad a menos que sea absolutamente necesario y con el consejo del mdico.   Use slo zapatos bajos sin taco para tener un mejor equilibrio y prevenir cadas.  EL CONSUMO DE MEDICAMENTOS Y DROGAS DURANTE EL EMBARAZO  Contine tomando las vitaminas apropiadas para esta etapa tal como se le indic. Las vitaminas deben contener un miligramo de cido flico y deben suplementarse con hierro. Guarde todas las vitaminas fuera del alcance de los nios. La ingestin de slo un par de vitaminas o comprimidos que contengan hierro pueden ocasionar la Newmont Mining en un beb o en un nio pequeo.   Evite el uso de [place], inclusive los de venta [place], que no hayan sido prescritos o indicados por el profesional que la asiste. Algunos medicamentos pueden causar problemas fsicos al beb. Utilice los  medicamentos de venta libre o de prescripcin para Chief Technology Officer, Environmental health practitioner o la [place], segn se lo indique el profesional que lo asiste. No utilice aspirina, ibuprofeno (Motrin, Advil, Nuprin) o naproxeno (Aleve) a menos que el profesional la autorice.   El alcohol se asocia a cierto nmero de defectos del nacimiento, incluido el sndrome de alcoholismo fetal. Debe evitar el consumo de alcohol en cualquiera de sus formas. El cigarrillo causa nacimientos prematuros y bebs de bajo peso al nacer. Las drogas de la calle son muy nocivas para el beb y estn absolutamente prohibidas. Un beb que nace de American Express, ser adicto al nacer. Ese beb tendr los mismos sntomas de abstinencia que un adulto.   Infrmele al profesional si consume alguna droga.  SOLICITE ATENCIN MDICA SI: Tiene alguna preocupacin Academic librarian. Es mejor que llame para formular las preguntas si no puede esperar hasta la prxima visita, que sentirse  preocupada por ellas.  DECISIONES ACERCA DE LA CIRCUNCISIN Usted puede saber o no cul es el sexo de su beb. Si es un varn, ste es el momento de pensar acerca de la circuncisin. La circuncisin es la extirpacin del prepucio. Esta es la piel que cubre el extremo sensible del pene. No hay un motivo mdico que lo justifique. Generalmente la decisin se toma segn lo que sea popular en ese momento, o se basa en creencias religiosas. Podr conversar estos temas con el profesional que la asiste. SOLICITE ATENCIN MDICA DE INMEDIATO SI:  La temperatura oral se eleva sin motivo por encima de 102 F (38.9 C) o segn le indique el profesional que la asiste.   Tiene una prdida de lquido por la vagina (canal de parto). Si sospecha una ruptura de las [place], tmese la temperatura y llame al profesional para informarlo sobre esto.   Observa unas pequeas manchas, una hemorragia vaginal o elimina cogulos. Avsele al profesional acerca de la cantidad y de cuntos apsitos  est utilizando.   Presenta un olor desagradable en la secrecin vaginal y observa un cambio en el color, de transparente a blanco.   Ha vomitado durante ms de 24 horas.   Presenta escalofros o fiebre.   Comienza a sentir falta de aire.   Siente ardor al Beatrix Shipper.   [place] o sube ms de 900 g (ms de 2 libras), o segn lo indicado por el profesional que la asiste. Observa que sbitamente se le hinchan el rostro, las manos, los pies o las piernas.   Presenta dolor abdominal. Las molestias en el ligamento redondo son [name] causa benigna (no cancerosa) frecuente de Engineer, mining abdominal durante el Psychiatrist, pero el profesional que la asiste deber evaluarlo.   Presenta dolor de cabeza intenso que no se [place].   Si no siente los movimientos del beb durante ms de tres horas. Si piensa que el beb no se mueve tanto como lo haca habitualmente, coma algo que Psychologist, clinical y Target Corporation lado izquierdo durante [place]. El beb debe moverse al menos 4  5 veces por hora. Comunquese inmediatamente si el beb se mueve menos que lo indicado.   Se cae, se ve involucrada en un accidente automovilstico o sufre algn tipo de traumatismo.   En su hogar hay violencia mental o fsica.  Document Released: 06/12/2005 Document Revised: 08/22/2011 [facility] Patient Information 2012 [place], [place].Mtodo para contar los movimientos fetales (Fetal Movement Counts) Kaitlin Koch: __________________________________________________ Franco Nones probable de parto:____________________ En los embarazos de alto riesgo se recomienda contar las pataditas, pero tambin es una buena idea que lo hagan todas las [place]. Comience a contarlas a las 28 semanas de embarazo. Los movimientos fetales aumentan luego de una comida Immunologist o de comer o beber algo dulce (el nivel de azcar en la sangre est ms alto). Tambin es importante beber gran cantidad de lquidos (hidratarse bien) antes de contar. Si se recuesta sobre  el lado izquierdo mejorar la Designer, industrial/product, o puede sentarse en una silla cmoda con los brazos sobre el abdomen y sin distracciones que la rodeen. CONTANDO  Trate de contar a la AGCO Corporation lo haga.   Marque el da y la hora y vea cunto le lleva sentir 10 movimientos (patadas, agitaciones, sacudones, vueltas). Debe sentir al menos 10 movimientos en 2 horas. Probablemente sienta los 10 movimientos en menos de dos horas. Si no los siente, espere una hora y cuente nuevamente. Luego de Time Warner tendr un  patrn.   Debemos observar si hay cambios en el patrn o no hay suficientes pataditas en 2 horas. Le lleva ms tiempo contar los 10 movimientos?  SOLICITE ATENCIN MDICA SI:  Siente menos de 10 pataditas en 2 horas. Intntelo dos veces.   No siente movimientos durante 1 hora.   El patrn se modifica o le lleva ms tiempo Art gallery manager las 10 pataditas.   Siente que el beb no se mueve como lo hace habitualmente.  Fecha: ____________ Movimientos: ____________ Comienzo hora: ____________ Cephas Darby: ____________ Franco Nones: ____________ Movimientos: ____________ Comienzo hora: ____________ Cephas Darby: ____________ Franco Nones: ____________ Movimientos: ____________ Comienzo hora: ____________ Cephas Darby: ____________ Franco Nones: ____________ Movimientos: ____________ Comienzo hora: ____________ Cephas Darby: ____________ Franco Nones: ____________ Movimientos: ____________ Comienzo hora: ____________ Cephas Darby: ____________ Franco Nones: ____________ Movimientos: ____________ Comienzo hora: ____________ Cephas Darby: ____________ Franco Nones: ____________ Movimientos: ____________ Comienzo hora: ____________ Cephas Darby: ____________  Franco Nones: ____________ Movimientos: ____________ Comienzo hora: ____________ Cephas Darby: ____________ Franco Nones: ____________ Movimientos: ____________ Comienzo hora: ____________ Cephas Darby: ____________ Franco Nones: ____________ Movimientos: ____________ Comienzo hora: ____________ Cephas Darby:  ____________ Franco Nones: ____________ Movimientos: ____________ Comienzo hora: ____________ Cephas Darby: ____________ Franco Nones: ____________ Movimientos: ____________ Comienzo hora: ____________ Cephas Darby: ____________ Franco Nones: ____________ Movimientos: ____________ Comienzo hora: ____________ Cephas Darby: ____________ Franco Nones: ____________ Movimientos: ____________ Comienzo hora: ____________ Cephas Darby: ____________  Franco Nones: ____________ Movimientos: ____________ Comienzo hora: ____________ Cephas Darby: ____________ Franco Nones: ____________ Movimientos: ____________ Comienzo hora: ____________ Cephas Darby: ____________ Franco Nones: ____________ Movimientos: ____________ Comienzo hora: ____________ Cephas Darby: ____________ Franco Nones: ____________ Movimientos: ____________ Comienzo hora: ____________ Cephas Darby: ____________ Franco Nones: ____________ Movimientos: ____________ Comienzo hora: ____________ Cephas Darby: ____________ Franco Nones: ____________ Movimientos: ____________ Comienzo hora: ____________ Cephas Darby: ____________ Franco Nones: ____________ Movimientos: ____________ Comienzo hora: ____________ Cephas Darby: ____________  Franco Nones: ____________ Movimientos: ____________ Comienzo hora: ____________ Cephas Darby: ____________ Franco Nones: ____________ Movimientos: ____________ Comienzo hora: ____________ Cephas Darby: ____________ Franco Nones: ____________ Movimientos: ____________ Comienzo hora: ____________ Cephas Darby: ____________ Franco Nones: ____________ Movimientos: ____________ Comienzo hora: ____________ Cephas Darby: ____________ Franco Nones: ____________ Movimientos: ____________ Comienzo hora: ____________ Cephas Darby: ____________ Franco Nones: ____________ Movimientos: ____________ Comienzo hora: ____________ Cephas Darby: ____________ Franco Nones: ____________ Movimientos: ____________ Comienzo hora: ____________ Cephas Darby: ____________  Franco Nones: ____________ Movimientos: ____________ Comienzo hora: ____________ Cephas Darby: ____________ Franco Nones: ____________ Movimientos: ____________ Comienzo hora:  ____________ Cephas Darby: ____________ Franco Nones: ____________ Movimientos: ____________ Comienzo hora: ____________ Cephas Darby: ____________ Franco Nones: ____________ Movimientos: ____________ Comienzo hora: ____________ Cephas Darby: ____________ Franco Nones: ____________ Movimientos: ____________ Comienzo hora: ____________ Cephas Darby: ____________ Franco Nones: ____________ Movimientos: ____________ Comienzo hora: ____________ Cephas Darby: ____________ Franco Nones: ____________ Movimientos: ____________ Comienzo hora: ____________ Cephas Darby: ____________  Franco Nones: ____________ Movimientos: ____________ Comienzo hora: ____________ Cephas Darby: ____________ Franco Nones: ____________ Movimientos: ____________ Comienzo hora: ____________ Cephas Darby: ____________ Franco Nones: ____________ Movimientos: ____________ Comienzo hora: ____________ Cephas Darby: ____________ Franco Nones: ____________ Movimientos: ____________ Comienzo hora: ____________ Cephas Darby: ____________ Franco Nones: ____________ Movimientos: ____________ Comienzo hora: ____________ Cephas Darby: ____________ Franco Nones: ____________ Movimientos: ____________ Comienzo hora: ____________ Cephas Darby: ____________ Franco Nones: ____________ Movimientos: ____________ Comienzo hora: ____________ Cephas Darby: ____________  Franco Nones: ____________ Movimientos: ____________ Comienzo hora: ____________ Cephas Darby: ____________ Franco Nones: ____________ Movimientos: ____________ Comienzo hora: ____________ Cephas Darby: ____________ Franco Nones: ____________ Movimientos: ____________ Comienzo hora: ____________ Cephas Darby: ____________ Franco Nones: ____________ Movimientos: ____________ Comienzo hora: ____________ Cephas Darby: ____________ Franco Nones: ____________ Movimientos: ____________ Comienzo hora: ____________ Cephas Darby: ____________ Franco Nones: ____________ Movimientos: ____________ Comienzo hora: ____________ Cephas Darby: ____________ Franco Nones: ____________ Movimientos: ____________ Comienzo hora: ____________ Cephas Darby: ____________  Franco Nones: ____________ Movimientos: ____________  Comienzo hora: ____________ Cephas Darby: ____________ Franco Nones: ____________ Movimientos: ____________ Comienzo hora: ____________  Fin hora: ____________ Franco Nones: ____________ Movimientos: ____________ Comienzo hora: ____________ Cephas Darby: ____________ Franco Nones: ____________ Movimientos: ____________ Comienzo hora: ____________ Cephas Darby: ____________ Franco Nones: ____________ Movimientos: ____________ Comienzo hora: ____________ Cephas Darby: ____________ Franco Nones: ____________ Movimientos: ____________ Comienzo hora: ____________ Cephas Darby: ____________ Franco Nones: ____________ Movimientos: ____________ Comienzo hora: ____________ Cephas Darby: ____________  Document Released: 12/10/2007 Document Revised: 08/22/2011 [facility] Patient Information 2012 [employer].

## 2012-05-20 NOTE — Progress Notes (Signed)
Doing well.  No complaints Kick counts ok Denies Vaginal bleeding discharge, LOF, contractions, severe abdominal pain, HA, LE edema 1wk f/u in OB clinic Labor precautions reviewed GBS, GC/Chl today

## 2012-05-28 ENCOUNTER — Ambulatory Visit (INDEPENDENT_AMBULATORY_CARE_PROVIDER_SITE_OTHER): Payer: Self-pay | Admitting: Family Medicine

## 2012-05-28 ENCOUNTER — Encounter: Payer: Self-pay | Admitting: Family Medicine

## 2012-05-28 ENCOUNTER — Other Ambulatory Visit (HOSPITAL_COMMUNITY)
Admission: RE | Admit: 2012-05-28 | Discharge: 2012-05-28 | Disposition: A | Discharge status: Home / Self Care | Payer: Self-pay | Source: Ambulatory Visit | Attending: Family Medicine | Admitting: Family Medicine

## 2012-05-28 VITALS — BP 116/71 | Temp 98.6°F | Wt 175.1 lb

## 2012-05-28 DIAGNOSIS — Z113 Encounter for screening for infections with a predominantly sexual mode of transmission: Secondary | ICD-10-CM | POA: Insufficient documentation

## 2012-05-28 DIAGNOSIS — Z3480 Encounter for supervision of other normal pregnancy, unspecified trimester: Secondary | ICD-10-CM

## 2012-05-28 DIAGNOSIS — Z23 Encounter for immunization: Secondary | ICD-10-CM

## 2012-05-28 DIAGNOSIS — O98219 Gonorrhea complicating pregnancy, unspecified trimester: Secondary | ICD-10-CM

## 2012-05-28 LAB — GLUCOSE, CAPILLARY
Comment 1: 1
Glucose-Capillary: 103 mg/dL — ABNORMAL HIGH (ref 70–99)

## 2012-05-28 LAB — CBC WITH DIFFERENTIAL/PLATELET
Basophils Relative: 0 % (ref 0–1)
HCT: 37.1 % (ref 36.0–46.0)
Hemoglobin: 12.5 g/dL (ref 12.0–15.0)
MCHC: 33.7 g/dL (ref 30.0–36.0)
Monocytes Absolute: 0.8 10*3/uL (ref 0.1–1.0)
Monocytes Relative: 7 % (ref 3–12)
Neutro Abs: 7.8 10*3/uL — ABNORMAL HIGH (ref 1.7–7.7)

## 2012-05-28 LAB — OB RESULTS CONSOLE GC/CHLAMYDIA
Chlamydia: NEGATIVE
Gonorrhea: POSITIVE

## 2012-05-28 NOTE — Progress Notes (Unsigned)
26 yo [id number] at 65 and 6/7 here for follow up visit.  No complaints.  Still working in housekeeping, but plans to stop next week.   See flow sheet for details.   PE: Pelvic: Nl female genitalia.  No lesions. Nl vagina.  Nl cervix.  GBS/GC/CZ done. A/P: Term pregnancy.  Doing well.  No evidence of labor.  Reviewed labor precautions and kick counts.  28 week labs done today.  Glucola 103. Flu shot today. Tdap not available.  Still needs as soon as possible. Weight gain: discussed healthy exercise and food choices. Addendum: Gonorrhea positive - needs treatment.  Have not yet been able to reach patient.

## 2012-05-29 LAB — HIV ANTIBODY (ROUTINE TESTING W REFLEX): HIV: NONREACTIVE

## 2012-05-30 ENCOUNTER — Encounter: Payer: Self-pay | Admitting: Family Medicine

## 2012-06-01 DIAGNOSIS — O98219 Gonorrhea complicating pregnancy, unspecified trimester: Secondary | ICD-10-CM | POA: Insufficient documentation

## 2012-06-02 ENCOUNTER — Telehealth: Payer: Self-pay | Admitting: Family Medicine

## 2012-06-02 NOTE — Telephone Encounter (Signed)
Attempted again to reach pt with results of positive gonorrhea.  Same outgoing message, so I did not leave any information.  Pt does have appt in 2 days with Dr. Konrad Dolores, so could be treated at that appointment.  Will not send letter since she could not get it before her next appointment.

## 2012-06-04 ENCOUNTER — Ambulatory Visit (INDEPENDENT_AMBULATORY_CARE_PROVIDER_SITE_OTHER): Payer: Self-pay | Admitting: Family Medicine

## 2012-06-04 VITALS — BP 117/80 | Temp 98.3°F | Wt 173.3 lb

## 2012-06-04 DIAGNOSIS — IMO0002 Reserved for concepts with insufficient information to code with codable children: Secondary | ICD-10-CM

## 2012-06-04 DIAGNOSIS — A5400 Gonococcal infection of lower genitourinary tract, unspecified: Secondary | ICD-10-CM

## 2012-06-04 DIAGNOSIS — Z3480 Encounter for supervision of other normal pregnancy, unspecified trimester: Secondary | ICD-10-CM

## 2012-06-04 DIAGNOSIS — Z8619 Personal history of other infectious and parasitic diseases: Secondary | ICD-10-CM

## 2012-06-04 DIAGNOSIS — O98219 Gonorrhea complicating pregnancy, unspecified trimester: Secondary | ICD-10-CM

## 2012-06-04 MED ORDER — CEFTRIAXONE SODIUM 1 G IJ SOLR
250.0000 mg | Freq: Once | INTRAMUSCULAR | Status: AC
  Administered 2012-06-04: 250 mg via INTRAMUSCULAR

## 2012-06-04 MED ORDER — AZITHROMYCIN 1 G PO PACK
1.0000 g | PACK | Freq: Once | ORAL | Status: AC
  Administered 2012-06-04: 1 g via ORAL

## 2012-06-04 NOTE — Progress Notes (Signed)
[id number] at [redacted]w[redacted]d here for f/u. No complaints today. Has finished working Denies any sex for greater than 1 mo and has been monogamous w/ husband.  Denies any vaginal irritation or abdominal pain Discussed GC resulsts. Pt to be retested today but will administer rocephin and Azithro WIll call pt w/ lab results Labor precautions reviewed Reasons for eval by MAU discussed

## 2012-06-04 NOTE — Patient Instructions (Addendum)
THank you for coming in today Please call tomorrow for the test results [phone] Please go to [facility] if you start bleeding or start having regular contractions, or if you don't feel the baby move.  Please come back in 1 week. Gracias por venir hoy Por favor llame maana para los [name] pruebas [phone] Por favor, vaya al hospital para mujer si se inicia el sangrado o comienza a tener contracciones regulares, o si usted no siente los movimientos del beb. Por favor, vuelve en 1 semana.  Kaitlin Koch - Systems analyst trimestre (Pregnancy - Third Trimester) El tercer trimestre del embarazo (los ltimos 3 meses) es el perodo de cambios ms rpidos que atraviesan usted y el beb. El aumento de peso es ms rpido. El beb alcanza un largo de aproximadamente 50 cm (20 pulgadas) y pesa entre 2,700 y 4,500 kg (6 a 10 libras). El beb gana ms tejido graso y ya est listo para la vida fuera del cuerpo de la [name]. Mientras estn en el interior, los bebs tienen perodos de sueo y vigilia, Warehouse manager y tienen hipo. Quizs sienta pequeas contracciones del tero. Este es el falso trabajo de [name]. Tambin se las conoce como contracciones de Braxton-H[place]. Es como una prctica del parto. Los problemas ms habituales de esta etapa del embarazo incluyen mayor dificultad para respirar, hinchazn de las manos y los pies por retencin de lquidos y la necesidad de Geographical information systems officer con ms frecuencia debido a que el tero y el beb presionan sobre la vejiga.  EXAMENES PRENATALES  Durante los [employer], deber seguir realizando pruebas de [place], segn avance el [place]. Estas pruebas se realizan para controlar su salud y la del beb. Tambin se realizan anlisis de sangre para [employer] niveles de [place]. La anemia (bajo nivel de hemoglobina) es frecuente durante el embarazo. Para prevenirla, se administran hierro y vitaminas. Tambin le harn nuevas pruebas para descartar la diabetes. Podrn  repetirle algunas de las [employer] hicieron previamente.   En cada visita le medirn el tamao del tero. Es para asegurarse de que el beb se desarrolla correctamente.   Tambin en cada visita la pesarn. Esto se realiza para asegurarse de que aumenta de peso al ritmo indicado y que usted y su beb evolucionan normalmente.   En algunas ocasiones se realiza una ecografa para confirmar el correcto desarrollo y evolucin del beb. Esta prueba se realiza con ondas sonoras inofensivas para el beb, de modo que el profesional pueda calcular con ms precisin la fecha del [name].   Discuta las posibilidades de la anestesia si necesita cesrea.  Algunas veces se realizan pruebas especializadas del lquido amnitico que rodea al beb. Esta prueba se denomina amniocentesis. El lquido amnitico se obtiene introduciendo una aguja en el abdomen (vientre). En ocasiones se lleva a cabo cerca del final del embarazo, si es Optician, dispensing. En este caso se realiza para asegurarse de que los pulmones del beb estn lo suficientemente maduros como para que pueda vivir fuera del tero. CAMBIOS QUE OCURREN EN EL TERCER TRIMESTRE DEL EMBARAZO Su organismo atravesar diferentes cambios durante el embarazo que varan de [name] persona a Educational psychologist. Converse con el profesional que la asiste acerca los cambios que usted note y que la preocupen.  Durante el ltimo trimestre probablemente sienta un aumento del apetito. Es normal tener "antojos" de Development worker, community. Esto vara de [name] persona a otra y de un embarazo a Therapist, art.   Podrn aparecer las primeras [employer] caderas,  abdomen y mamas. Estos son cambios normales del cuerpo durante el [place]. No existen medicamentos ni ejercicios que puedan prevenir [employer].   El estreimiento puede tratarse con un laxante o agregando fibra a su dieta. Beber grandes cantidades de lquidos, tomar fibras en forma de verduras, frutas y granos integrales es de [place].     Tambin es beneficioso practicar actividad fsica. Si ha sido una persona Engineer, mining, podr continuar con la [employer] de las actividades durante el mismo. Si ha sido [employer], puede ser beneficioso que comience con un programa de ejercicios, Museum/gallery exhibitions officer. Consulte con el profesional que la asiste antes de comenzar un programa de ejercicios.   Evite el consumo de cigarrillos, el alcohol, los medicamentos no prescritos y las "drogas de la calle" durante el [place]. Estas sustancias qumicas afectan la formacin y el desarrollo del beb. Evite estas sustancias durante todo el embarazo para asegurar el nacimiento de un beb sano.   Dolor de espalda, venas varicosas y hemorroides podran aparecer o empeorar.   Los movimientos del beb pueden ser ms bruscos y aparecer ms a menudo.   Puede que note dificultades para respirar facilmente.   El ombligo podra salrsele hacia afuera.   Puede segregar un lquido amarillento (calostro) de las [place].   Puede segregar mucus con sangre. Esto normalmente ocurre unos [address] a una semana antes de que comience el [place] de [place].  INSTRUCCIONES PARA EL CUIDADO DOMICILIARIO  La mayor parte de los cuidados que se aconsejan son los mismos que los indicados para las primeras etapas del Psychiatrist. Es importante que concurra a todas las citas con el profesional y siga sus instrucciones con Camera operator a los medicamentos que deba Chemical engineer, a la actividad fsica y a Psychologist, forensic.   Durante el embarazo debe obtener nutrientes para usted y para su beb. Consuma alimentos balanceados a intervalos regulares. Elija alimentos como carne, pescado, [place] y otros productos lcteos descremados, verduras, frutas, panes integrales y cereales. El Equities trader cul es el aumento de peso ideal.   Las relaciones sexuales pueden continuarse hasta casi el final del embarazo, si no se presentan otros problemas como prdida prematura (antes de  tiempo) de lquido amnitico, hemorragia vaginal o dolor abdominal (en el vientre).   Realice [employer], si no tiene restricciones. Consulte con el profesional que la asiste si no sabe con certeza si determinados ejercicios son seguros. El mayor aumento de peso se produce [employer]ocker ltimos trimestres del [place].   Haga reposo con frecuencia, con las piernas elevadas, o segn lo necesite para evitar los calambres y el dolor de cintura.   Use un buen sostn o como los que se usan para hacer deportes para Paramedic la sensibilidad de las [place]. Tambin puede serle til si lo [place] mientras duerme. Si pierde Product manager, podr [employer].   No utilice la baera con agua caliente, baos turcos y saunas.   Colquese el cinturn de seguridad cuando conduzca. Este la proteger a usted y al beb en caso de accidente.   Evite comer carne cruda y el contacto con los utensilios y desperdicios de los gatos. Estos elementos contienen grmenes que pueden causar defectos de nacimiento en el beb.   Es fcil perder algo de orina durante el [place]. Apretar y Chief Operating Officer los msculos de la pelvis la ayudar con este problema. Practique detener la miccin cuando est en el bao. Estos son los mismos msculos que necesita  fortalecer. Son [employer] mismos msculos que utiliza cuando trata de [employer]der System gases. Puede practicar apretando estos msculos Well[employer], y repetir esto tres veces por da aproximadamente. Una vez que conozca qu msculos debe contraer, no realice estos ejercicios durante la miccin. Puede favorecerle una infeccin si la orina vuelve hacia atrs.   Pida ayuda si tiene necesidades econmicas, de asesoramiento o nutricionales durante el [place]. El profesional podr ayudarla con respecto a estas necesidades, o derivarla a otros especialistas.   Practique la ida [facility] a modo de [place].   Tome clases prenatales junto con su pareja para comprender,  practicar y hacer preguntas acerca del Aleen Campi de parto y el nacimiento.   Prepare la habitacin del beb.   No viaje fuera de la ciudad a menos que sea absolutamente necesario y con el consejo del mdico.   Use slo zapatos bajos sin taco para tener un mejor equilibrio y prevenir cadas.  EL CONSUMO DE MEDICAMENTOS Y DROGAS DURANTE EL EMBARAZO  Contine tomando las vitaminas apropiadas para esta etapa tal como se le indic. Las vitaminas deben contener un miligramo de cido flico y deben suplementarse con hierro. Guarde todas las vitaminas fuera del alcance de los nios. La ingestin de slo un par de vitaminas o comprimidos que contengan hierro pueden ocasionar la [employer] Mining en un beb o en un nio pequeo.   Evite el uso de [place], inclusive los de venta [place], que no hayan sido prescritos o indicados por el profesional que la asiste. Algunos medicamentos pueden causar problemas fsicos al beb. Utilice los medicamentos de venta libre o de prescripcin para Chief Technology Officer, Environmental health practitioner o la [place], segn se lo indique el profesional que lo asiste. No utilice aspirina, ibuprofeno (Motrin, Advil, Nuprin) o naproxeno (Aleve) a menos que el profesional la autorice.   El alcohol se asocia a cierto nmero de defectos del nacimiento, incluido el sndrome de alcoholismo fetal. Debe evitar el consumo de alcohol en cualquiera de sus formas. El cigarrillo causa nacimientos prematuros y bebs de bajo peso al nacer. Las drogas de la calle son muy nocivas para el beb y estn absolutamente prohibidas. Un beb que nace de [employer], ser adicto al nacer. Ese beb tendr los mismos sntomas de abstinencia que un adulto.   Infrmele al profesional si consume alguna droga.  SOLICITE ATENCIN MDICA SI: Tiene alguna preocupacin Academic librarian. Es mejor que llame para formular las preguntas si no puede esperar hasta la prxima visita, que sentirse preocupada por ellas.  DECISIONES ACERCA DE LA  CIRCUNCISIN Usted puede saber o no cul es el sexo de su beb. Si es un varn, ste es el momento de pensar acerca de la circuncisin. La circuncisin es la extirpacin del prepucio. Esta es la piel que cubre el extremo sensible del pene. No hay un motivo mdico que lo justifique. Generalmente la decisin se toma segn lo que sea popular en ese momento, o se basa en creencias religiosas. Podr conversar estos temas con el profesional que la asiste. SOLICITE ATENCIN MDICA DE INMEDIATO SI:  La temperatura oral se eleva sin motivo por encima de 102 F (38.9 C) o segn le indique el profesional que la asiste.   Tiene una prdida de lquido por la vagina (canal de parto). Si sospecha una ruptura de las [place], tmese la temperatura y llame al profesional para informarlo sobre esto.   Observa unas pequeas manchas, una hemorragia vaginal o elimina cogulos. Avsele al profesional acerca de la cantidad  y de cuntos apsitos est utilizando.   Presenta un olor desagradable en la secrecin vaginal y observa un cambio en el color, de transparente a blanco.   Ha vomitado durante ms de 24 horas.   Presenta escalofros o fiebre.   Comienza a sentir falta de aire.   Siente ardor al Beatrix Shipper.   [place] o sube ms de 900 g (ms de 2 libras), o segn lo indicado por el profesional que la asiste. Observa que sbitamente se le hinchan el rostro, las manos, los pies o las piernas.   Presenta dolor abdominal. Las molestias en el ligamento redondo son [name] causa benigna (no cancerosa) frecuente de Engineer, mining abdominal durante el Psychiatrist, pero el profesional que la asiste deber evaluarlo.   Presenta dolor de cabeza intenso que no se [place].   Si no siente los movimientos del beb durante ms de tres horas. Si piensa que el beb no se mueve tanto como lo haca habitualmente, coma algo que Psychologist, clinical y Target Corporation lado izquierdo durante Alex. El beb debe moverse al menos 4  5 veces por hora.  Comunquese inmediatamente si el beb se mueve menos que lo indicado.   Se cae, se ve involucrada en un accidente automovilstico o sufre algn tipo de traumatismo.   En su hogar hay violencia mental o fsica.  Document Released: 06/12/2005 Document Revised: 08/22/2011 [employer] Patient Information 2012 [place], [place]. Mtodo para contar los movimientos fetales (Fetal Movement Counts) Nombre de la paciente: __________________________________________________ Franco Nones probable de parto:____________________ En los embarazos de alto riesgo se recomienda contar las pataditas, pero tambin es una buena idea que lo hagan todas las [place]. Comience a contarlas a las 28 semanas de embarazo. Los movimientos fetales aumentan luego de una comida Immunologist o de comer o beber algo dulce (el nivel de azcar en la sangre est ms alto). Tambin es importante beber gran cantidad de lquidos (hidratarse bien) antes de contar. Si se recuesta sobre el lado izquierdo mejorar la Designer, industrial/product, o puede sentarse en una silla cmoda con los brazos sobre el abdomen y sin distracciones que la rodeen. CONTANDO  Trate de contar a la [employer] Corporation lo haga.   Marque el da y la hora y vea cunto le lleva sentir 10 movimientos (patadas, agitaciones, sacudones, vueltas). Debe sentir al menos 10 movimientos en 2 horas. Probablemente sienta los 10 movimientos en menos de dos horas. Si no los siente, espere una hora y cuente nuevamente. Luego de [employer] tendr un patrn.   Debemos observar si hay cambios en el patrn o no hay suficientes pataditas en 2 horas. Le lleva ms tiempo contar los 10 movimientos?  SOLICITE ATENCIN MDICA SI:  Siente menos de 10 pataditas en 2 horas. Intntelo dos veces.   No siente movimientos durante 1 hora.   El patrn se modifica o le lleva ms tiempo Art gallery manager las 10 pataditas.   Siente que el beb no se mueve como lo hace habitualmente.  Fecha: ____________  Movimientos: ____________ Comienzo hora: ____________ Cephas Darby: ____________ Franco Nones: ____________ Movimientos: ____________ Comienzo hora: ____________ Cephas Darby: ____________ Franco Nones: ____________ Movimientos: ____________ Comienzo hora: ____________ Cephas Darby: ____________ Franco Nones: ____________ Movimientos: ____________ Comienzo hora: ____________ Cephas Darby: ____________ Franco Nones: ____________ Movimientos: ____________ Comienzo hora: ____________ Cephas Darby: ____________ Franco Nones: ____________ Movimientos: ____________ Comienzo hora: ____________ Cephas Darby: ____________ Franco Nones: ____________ Movimientos: ____________ Comienzo hora: ____________ Cephas Darby: ____________  Franco Nones: ____________ Movimientos: ____________ Comienzo hora: ____________ Cephas Darby: ____________ Franco Nones: ____________ Movimientos: ____________ Comienzo  hora: ____________ Cephas Darby: ____________ Franco Nones: ____________ Movimientos: ____________ Comienzo hora: ____________ Cephas Darby: ____________ Franco Nones: ____________ Movimientos: ____________ Comienzo hora: ____________ Cephas Darby: ____________ Franco Nones: ____________ Movimientos: ____________ Comienzo hora: ____________ Cephas Darby: ____________ Franco Nones: ____________ Movimientos: ____________ Comienzo hora: ____________ Cephas Darby: ____________ Franco Nones: ____________ Movimientos: ____________ Comienzo hora: ____________ Cephas Darby: ____________  Franco Nones: ____________ Movimientos: ____________ Comienzo hora: ____________ Cephas Darby: ____________ Franco Nones: ____________ Movimientos: ____________ Comienzo hora: ____________ Cephas Darby: ____________ Franco Nones: ____________ Movimientos: ____________ Comienzo hora: ____________ Cephas Darby: ____________ Franco Nones: ____________ Movimientos: ____________ Comienzo hora: ____________ Cephas Darby: ____________ Franco Nones: ____________ Movimientos: ____________ Comienzo hora: ____________ Cephas Darby: ____________ Franco Nones: ____________ Movimientos: ____________ Comienzo hora: ____________ Cephas Darby: ____________ Franco Nones:  ____________ Movimientos: ____________ Comienzo hora: ____________ Cephas Darby: ____________  Franco Nones: ____________ Movimientos: ____________ Comienzo hora: ____________ Cephas Darby: ____________ Franco Nones: ____________ Movimientos: ____________ Comienzo hora: ____________ Cephas Darby: ____________ Franco Nones: ____________ Movimientos: ____________ Comienzo hora: ____________ Cephas Darby: ____________ Franco Nones: ____________ Movimientos: ____________ Comienzo hora: ____________ Cephas Darby: ____________ Franco Nones: ____________ Movimientos: ____________ Comienzo hora: ____________ Cephas Darby: ____________ Franco Nones: ____________ Movimientos: ____________ Comienzo hora: ____________ Cephas Darby: ____________ Franco Nones: ____________ Movimientos: ____________ Comienzo hora: ____________ Cephas Darby: ____________  Franco Nones: ____________ Movimientos: ____________ Comienzo hora: ____________ Cephas Darby: ____________ Franco Nones: ____________ Movimientos: ____________ Comienzo hora: ____________ Cephas Darby: ____________ Franco Nones: ____________ Movimientos: ____________ Comienzo hora: ____________ Cephas Darby: ____________ Franco Nones: ____________ Movimientos: ____________ Comienzo hora: ____________ Cephas Darby: ____________ Franco Nones: ____________ Movimientos: ____________ Comienzo hora: ____________ Cephas Darby: ____________ Franco Nones: ____________ Movimientos: ____________ Comienzo hora: ____________ Cephas Darby: ____________ Franco Nones: ____________ Movimientos: ____________ Comienzo hora: ____________ Cephas Darby: ____________  Franco Nones: ____________ Movimientos: ____________ Comienzo hora: ____________ Cephas Darby: ____________ Franco Nones: ____________ Movimientos: ____________ Comienzo hora: ____________ Cephas Darby: ____________ Franco Nones: ____________ Movimientos: ____________ Comienzo hora: ____________ Cephas Darby: ____________ Franco Nones: ____________ Movimientos: ____________ Comienzo hora: ____________ Cephas Darby: ____________ Franco Nones: ____________ Movimientos: ____________ Comienzo hora: ____________ Cephas Darby:  ____________ Franco Nones: ____________ Movimientos: ____________ Comienzo hora: ____________ Cephas Darby: ____________ Franco Nones: ____________ Movimientos: ____________ Comienzo hora: ____________ Cephas Darby: ____________  Franco Nones: ____________ Movimientos: ____________ Comienzo hora: ____________ Cephas Darby: ____________ Franco Nones: ____________ Movimientos: ____________ Comienzo hora: ____________ Cephas Darby: ____________ Franco Nones: ____________ Movimientos: ____________ Comienzo hora: ____________ Cephas Darby: ____________ Franco Nones: ____________ Movimientos: ____________ Comienzo hora: ____________ Cephas Darby: ____________ Franco Nones: ____________ Movimientos: ____________ Comienzo hora: ____________ Cephas Darby: ____________ Franco Nones: ____________ Movimientos: ____________ Comienzo hora: ____________ Cephas Darby: ____________ Franco Nones: ____________ Movimientos: ____________ Comienzo hora: ____________ Cephas Darby: ____________  Franco Nones: ____________ Movimientos: ____________ Comienzo hora: ____________ Cephas Darby: ____________ Franco Nones: ____________ Movimientos: ____________ Comienzo hora: ____________ Cephas Darby: ____________ Franco Nones: ____________ Movimientos: ____________ Comienzo hora: ____________ Cephas Darby: ____________ Franco Nones: ____________ Movimientos: ____________ Comienzo hora: ____________ Cephas Darby: ____________ Franco Nones: ____________ Movimientos: ____________ Comienzo hora: ____________ Cephas Darby: ____________ Franco Nones: ____________ Movimientos: ____________ Comienzo hora: ____________ Cephas Darby: ____________ Franco Nones: ____________ Movimientos: ____________ Comienzo hora: ____________ Fin hora: ____________  Document Released: 12/10/2007 Document Revised: 08/22/2011 [employer]Care Patient Information 2012 [place] [place].

## 2012-06-04 NOTE — Assessment & Plan Note (Signed)
Azithro and Rocephin on 06/04/12. Recheck GC

## 2012-06-06 ENCOUNTER — Inpatient Hospital Stay (HOSPITAL_COMMUNITY)
Admission: AD | Admit: 2012-06-06 | Discharge: 2012-06-06 | Disposition: A | Discharge status: Home / Self Care | Payer: Self-pay | Source: Ambulatory Visit | Attending: Obstetrics & Gynecology | Admitting: Obstetrics & Gynecology

## 2012-06-06 DIAGNOSIS — O4703 False labor before 37 completed weeks of gestation, third trimester: Secondary | ICD-10-CM

## 2012-06-06 DIAGNOSIS — O479 False labor, unspecified: Secondary | ICD-10-CM | POA: Insufficient documentation

## 2012-06-06 DIAGNOSIS — O4700 False labor before 37 completed weeks of gestation, unspecified trimester: Secondary | ICD-10-CM

## 2012-06-06 DIAGNOSIS — O99891 Other specified diseases and conditions complicating pregnancy: Secondary | ICD-10-CM | POA: Insufficient documentation

## 2012-06-06 DIAGNOSIS — R030 Elevated blood-pressure reading, without diagnosis of hypertension: Secondary | ICD-10-CM | POA: Diagnosis present

## 2012-06-06 LAB — COMPREHENSIVE METABOLIC PANEL
AST: 17 U/L (ref 0–37)
Albumin: 2.8 g/dL — ABNORMAL LOW (ref 3.5–5.2)
Alkaline Phosphatase: 194 U/L — ABNORMAL HIGH (ref 39–117)
BUN: 13 mg/dL (ref 6–23)
Chloride: 101 mEq/L (ref 96–112)
Potassium: 3.6 mEq/L (ref 3.5–5.1)
Total Bilirubin: 0.3 mg/dL (ref 0.3–1.2)
Total Protein: 6.6 g/dL (ref 6.0–8.3)

## 2012-06-06 LAB — URINALYSIS, ROUTINE W REFLEX MICROSCOPIC
Bilirubin Urine: NEGATIVE
Ketones, ur: NEGATIVE mg/dL
Nitrite: NEGATIVE
Protein, ur: NEGATIVE mg/dL
pH: 6 (ref 5.0–8.0)

## 2012-06-06 LAB — CBC
HCT: 34.9 % — ABNORMAL LOW (ref 36.0–46.0)
MCH: 30.4 pg (ref 26.0–34.0)
MCV: 91.4 fL (ref 78.0–100.0)
RBC: 3.82 MIL/uL — ABNORMAL LOW (ref 3.87–5.11)
WBC: 12.1 10*3/uL — ABNORMAL HIGH (ref 4.0–10.5)

## 2012-06-06 LAB — PROTEIN / CREATININE RATIO, URINE: Protein Creatinine Ratio: 0.14 (ref 0.00–0.15)

## 2012-06-06 NOTE — MAU Note (Signed)
Patient is in for labor eval. She denies lof. She reports good fetal movement.

## 2012-06-06 NOTE — MAU Provider Note (Signed)
History    CSN: [id number]  Arrival date and time: 06/06/12 2031   First Provider Initiated Contact with Patient 06/06/12 2112     Chief Complaint  Patient presents with  . Contractions   HPI 26 y.o. [id number] at [redacted]w[redacted]d presented to MAU with report of contractions.  Denies VB, LOF. Endorses good FM. Seen at [facility] by Dr. Shelly Flatten, no problems during during pregnancy. Patient is Spanish speaking, encounter done with help of interpreter.  OB History    Grav Para Term Preterm Abortions TAB SAB Ect Mult Living   4 2 2  1  1   2      Past Medical History  Diagnosis Date  . Stroke     Pt gives h/o "stroke" at age 77 or 54.  Occured in [place].  Describes tearing, facial paralysis, trouble chewing.  Reports her arm and leg on that side were also affected.  Treated with injections, special gum to help her regain function of her chewing.  Report effects resolved complete by 1 year.  No further problems or effects.  No repeat of this episode.  Story consistent with Bell's Palsy.    Past Surgical History  Procedure Date  . No past surgeries   . Brain surgery     Family History  Problem Relation Age of Onset  . Depression Neg Hx   . Other Neg Hx     History  Substance Use Topics  . Smoking status: Never Smoker   . Smokeless tobacco: Never Used  . Alcohol Use: No    Allergies:  Allergies  Allergen Reactions  . Prenatal Vitamins Hives and Rash    Prescriptions prior to admission  Medication Sig Dispense Refill  . acetaminophen (TYLENOL) 325 MG tablet Take 325 mg by mouth every 6 (six) hours as needed. pain      . Prenatal Vit-Fe Fumarate-FA (PRENATAL MULTIVITAMIN) TABS Take 1 tablet by mouth daily.        ROS Physical Exam   Blood pressure 125/71, pulse 70, temperature 98.1 F (36.7 C), temperature source Oral, resp. rate 18, weight 79.379 kg (175 lb), last menstrual period 08/20/2011. Initial BP upon arrival were in 140s-150s/70s-80s with contractions. No history of  HTN or BP problems throughout pregnancy, range 105-120/63-80. FHR 130s, mod var, + accels, no decels. Toco: irregular contractions Physical Exam Gen: NAD Abd: soft, NT, ND, gravid Cervix: 1.5/60/-2/vertex, unchanged over one hour  Results for orders placed during the hospital encounter of 06/06/12 (from the past 24 hour(s))  URINALYSIS, ROUTINE W REFLEX MICROSCOPIC     Status: Abnormal   Collection Time   06/06/12  9:18 PM      Component Value Range   Color, Urine YELLOW  YELLOW   APPearance CLEAR  CLEAR   Specific Gravity, Urine >1.030 (*) 1.005 - 1.030   pH 6.0  5.0 - 8.0   Glucose, UA NEGATIVE  NEGATIVE mg/dL   Hgb urine dipstick NEGATIVE  NEGATIVE   Bilirubin Urine NEGATIVE  NEGATIVE   Ketones, ur NEGATIVE  NEGATIVE mg/dL   Protein, ur NEGATIVE  NEGATIVE mg/dL   Urobilinogen, UA 1.0  0.0 - 1.0 mg/dL   Nitrite NEGATIVE  NEGATIVE   Leukocytes, UA NEGATIVE  NEGATIVE  COMPREHENSIVE METABOLIC PANEL     Status: Abnormal   Collection Time   06/06/12  9:26 PM      Component Value Range   Sodium 132 (*) 135 - 145 mEq/L   Potassium 3.6  3.5 - 5.1 mEq/L  Chloride 101  96 - 112 mEq/L   CO2 21  19 - 32 mEq/L   Glucose, Bld 104 (*) 70 - 99 mg/dL   BUN 13  6 - 23 mg/dL   Creatinine, Ser 0.98  0.50 - 1.10 mg/dL   Calcium 8.9  8.4 - 11.9 mg/dL   Total Protein 6.6  6.0 - 8.3 g/dL   Albumin 2.8 (*) 3.5 - 5.2 g/dL   AST 17  0 - 37 U/L   ALT 15  0 - 35 U/L   Alkaline Phosphatase 194 (*) 39 - 117 U/L   Total Bilirubin 0.3  0.3 - 1.2 mg/dL   GFR calc non Af Amer >90  >90 mL/min   GFR calc Af Amer >90  >90 mL/min  CBC     Status: Abnormal   Collection Time   06/06/12  9:26 PM      Component Value Range   WBC 12.1 (*) 4.0 - 10.5 K/uL   RBC 3.82 (*) 3.87 - 5.11 MIL/uL   Hemoglobin 11.6 (*) 12.0 - 15.0 g/dL   HCT 14.7 (*) 82.9 - 56.2 %   MCV 91.4  78.0 - 100.0 fL   MCH 30.4  26.0 - 34.0 pg   MCHC 33.2  30.0 - 36.0 g/dL   RDW 13.0  86.5 - 78.4 %   Platelets 218  150 - 400 K/uL    MAU  Course  Procedures  BP stabilized to 120s/70-80s in MAU. No HA, visual symptoms, RUQ/epigastric pain. Normal PIH labs, no proteinuria.  Assessment and Plan   Elevated BP readings in setting of contractions, normal labs. Will recheck in office on 06/08/12. False labor, patient sent home with labor and FM precautions  Jaynie Collins, MD, FACOG Attending Obstetrician & Gynecologist Faculty Practice, [facility] [place]

## 2012-06-07 ENCOUNTER — Encounter (HOSPITAL_COMMUNITY): Payer: Self-pay | Admitting: *Deleted

## 2012-06-07 ENCOUNTER — Inpatient Hospital Stay (HOSPITAL_COMMUNITY)
Admission: AD | Admit: 2012-06-07 | Discharge: 2012-06-09 | DRG: 775 | Disposition: A | Discharge status: Home / Self Care | Payer: Medicaid Other | Source: Ambulatory Visit | Attending: Obstetrics and Gynecology | Admitting: Obstetrics and Gynecology

## 2012-06-07 DIAGNOSIS — O98219 Gonorrhea complicating pregnancy, unspecified trimester: Secondary | ICD-10-CM

## 2012-06-07 DIAGNOSIS — IMO0001 Reserved for inherently not codable concepts without codable children: Secondary | ICD-10-CM

## 2012-06-07 DIAGNOSIS — R030 Elevated blood-pressure reading, without diagnosis of hypertension: Secondary | ICD-10-CM

## 2012-06-07 LAB — CBC
Hemoglobin: 12.3 g/dL (ref 12.0–15.0)
MCV: 91.1 fL (ref 78.0–100.0)
Platelets: 235 10*3/uL (ref 150–400)
RBC: 4.03 MIL/uL (ref 3.87–5.11)
WBC: 17.2 10*3/uL — ABNORMAL HIGH (ref 4.0–10.5)

## 2012-06-07 MED ORDER — CITRIC ACID-SODIUM CITRATE 334-500 MG/5ML PO SOLN: 30.0000 mL | ORAL | Status: DC | PRN

## 2012-06-07 MED ORDER — LACTATED RINGERS IV SOLN: INTRAVENOUS | Status: DC

## 2012-06-07 MED ORDER — HYDROXYZINE HCL 50 MG/ML IM SOLN: 50.0000 mg | Freq: Four times a day (QID) | INTRAMUSCULAR | Status: DC | PRN

## 2012-06-07 MED ORDER — NALBUPHINE SYRINGE 5 MG/0.5 ML
5.0000 mg | INJECTION | INTRAMUSCULAR | Status: DC | PRN
  Administered 2012-06-07: 5 mg via INTRAVENOUS
  Filled 2012-06-07: qty 0.5

## 2012-06-07 MED ORDER — HYDROXYZINE HCL 50 MG PO TABS: 50.0000 mg | ORAL_TABLET | Freq: Four times a day (QID) | ORAL | Status: DC | PRN

## 2012-06-07 MED ORDER — LIDOCAINE HCL (PF) 1 % IJ SOLN
30.0000 mL | INTRAMUSCULAR | Status: DC | PRN
  Filled 2012-06-07: qty 30

## 2012-06-07 MED ORDER — IBUPROFEN 600 MG PO TABS
600.0000 mg | ORAL_TABLET | Freq: Four times a day (QID) | ORAL | Status: DC | PRN
  Administered 2012-06-07: 600 mg via ORAL
  Filled 2012-06-07: qty 1

## 2012-06-07 MED ORDER — ACETAMINOPHEN 325 MG PO TABS: 650.0000 mg | ORAL_TABLET | ORAL | Status: DC | PRN

## 2012-06-07 MED ORDER — OXYCODONE-ACETAMINOPHEN 5-325 MG PO TABS: 1.0000 | ORAL_TABLET | ORAL | Status: DC | PRN

## 2012-06-07 MED ORDER — LACTATED RINGERS IV SOLN: 500.0000 mL | INTRAVENOUS | Status: DC | PRN

## 2012-06-07 MED ORDER — OXYTOCIN BOLUS FROM INFUSION
500.0000 mL | Freq: Once | INTRAVENOUS | Status: DC
  Filled 2012-06-07: qty 500

## 2012-06-07 MED ORDER — ONDANSETRON HCL 4 MG/2ML IJ SOLN: 4.0000 mg | Freq: Four times a day (QID) | INTRAMUSCULAR | Status: DC | PRN

## 2012-06-07 MED ORDER — OXYTOCIN 40 UNITS IN LACTATED RINGERS INFUSION - SIMPLE MED
62.5000 mL/h | Freq: Once | INTRAVENOUS | Status: AC
  Administered 2012-06-07: 62.5 mL/h via INTRAVENOUS
  Filled 2012-06-07: qty 1000

## 2012-06-07 NOTE — MAU Note (Signed)
Contractions, some bleeding.  

## 2012-06-07 NOTE — MAU Note (Signed)
Report called to Dr. Benjamin Stain, orders for admission rec;d.

## 2012-06-07 NOTE — H&P (Signed)
Kaitlin Koch is a 26 y.o. female presenting for active labor. She has been receiving care at [facility] w/ Kaitlin Koch as her primary.   Patient Active Problem List  Diagnosis  . Supervision of normal IUP (intrauterine pregnancy) in multigravida  . Gonorrhea complicating pregnancy  . Elevated blood pressure reading    History OB History    Grav Para Term Preterm Abortions TAB SAB Ect Mult Living   4 2 2  1  1   2      Past Medical History  Diagnosis Date  . Stroke     Pt gives h/o "stroke" at age 14 or 57.  Occured in [place].  Describes tearing, facial paralysis, trouble chewing.  Reports her arm and leg on that side were also affected.  Treated with injections, special gum to help her regain function of her chewing.  Report effects resolved complete by 1 year.  No further problems or effects.  No repeat of this episode.  Story consistent with Bell's Palsy.   Past Surgical History  Procedure Date  . No past surgeries   . Brain surgery    Family History: family history is negative for Depression and Other. Social History:  reports that she has never smoked. She has never used smokeless tobacco. She reports that she does not drink alcohol or use illicit drugs.   Prenatal Transfer Tool  Maternal Diabetes: No Genetic Screening: Declined Maternal Ultrasounds/Referrals: Normal Fetal Ultrasounds or other Referrals:  None Maternal Substance Abuse:  No Significant Maternal Medications:  None Significant Maternal Lab Results:  None Other Comments:  None  Review of Systems  Constitutional: Negative for fever and chills.  Eyes: Negative for blurred vision.  Neurological: Negative for headaches.    Dilation: 6.5 Effacement (%): 80 Station: -2 Exam by:: Kaitlin Coco RN Blood pressure 117/71, pulse 118, resp. rate 20, height 4\' 10"  (1.473 m), weight 79.379 kg (175 lb), last menstrual period 08/20/2011, SpO2 100.00%. Maternal Exam:  Uterine Assessment: Contraction strength is  firm.  Contraction frequency is regular.   Abdomen: Fundal height is S=D.   Fetal presentation: vertex  Pelvis: adequate for delivery.   Cervix: Cervix evaluated by digital exam.   Per RN  Fetal Exam Fetal Monitor Review: Mode: ultrasound.   Baseline rate: 140-150.  Variability: moderate (6-25 bpm).   Pattern: accelerations present and variable decelerations.    Fetal State Assessment: Category II - tracings are indeterminate.     Physical Exam  Nursing note and vitals reviewed. Constitutional: She is oriented to person, place, and time. She appears well-developed and well-nourished. She appears distressed.  HENT:  Head: Normocephalic.  Neck: Normal range of motion.  Cardiovascular: Normal rate and regular rhythm.   Respiratory: Effort normal and breath sounds normal.  GI: Soft. There is no tenderness.  Musculoskeletal: Normal range of motion. She exhibits no edema and no tenderness.  Neurological: She is alert and oriented to person, place, and time.  Skin: Skin is warm and dry.  Psychiatric: She has a normal mood and affect.    Prenatal labs: ABO, Rh: O/POS/-- (04/08 1322) Antibody: NEG (04/08 1322) Rubella: 204.1 (04/08 1322) RPR: NON REAC (09/12 1551)  HBsAg: NEGATIVE (04/08 1322)  HIV: NON REACTIVE (09/12 1551)  GBS: NEGATIVE (09/12 1606)  1 hour GTT: 94 Genetic screening: too late  Assessment: 1. Labor: active 2. Fetal Wellbeing: Category I  3. Pain Control: none 4. GBS: neg 5. 39.2 week IUP  Plan:  1. Admit to BS per consult with  MD 2. Routine L&D orders 3. Analgesia/anesthesia PRN  4. Dr. Konrad Koch paged.  Kaitlin Koch 06/07/2012, 8:29 PM

## 2012-06-08 ENCOUNTER — Encounter (HOSPITAL_COMMUNITY): Payer: Self-pay | Admitting: *Deleted

## 2012-06-08 LAB — RPR: RPR Ser Ql: NONREACTIVE

## 2012-06-08 MED ORDER — INFLUENZA VIRUS VACC SPLIT PF IM SUSP
0.5000 mL | INTRAMUSCULAR | Status: AC
  Administered 2012-06-09: 0.5 mL via INTRAMUSCULAR
  Filled 2012-06-08: qty 0.5

## 2012-06-08 MED ORDER — IBUPROFEN 600 MG PO TABS: 600.0000 mg | ORAL_TABLET | Freq: Four times a day (QID) | ORAL | Status: DC

## 2012-06-08 MED ORDER — SIMETHICONE 80 MG PO CHEW: 80.0000 mg | CHEWABLE_TABLET | ORAL | Status: DC | PRN

## 2012-06-08 MED ORDER — ONDANSETRON HCL 4 MG PO TABS: 4.0000 mg | ORAL_TABLET | ORAL | Status: DC | PRN

## 2012-06-08 MED ORDER — SENNOSIDES-DOCUSATE SODIUM 8.6-50 MG PO TABS
2.0000 | ORAL_TABLET | Freq: Every day | ORAL | Status: DC
  Administered 2012-06-08: 2 via ORAL

## 2012-06-08 MED ORDER — DIPHENHYDRAMINE HCL 25 MG PO CAPS: 25.0000 mg | ORAL_CAPSULE | Freq: Four times a day (QID) | ORAL | Status: DC | PRN

## 2012-06-08 MED ORDER — DIBUCAINE 1 % RE OINT: 1.0000 "application " | TOPICAL_OINTMENT | RECTAL | Status: DC | PRN

## 2012-06-08 MED ORDER — TETANUS-DIPHTH-ACELL PERTUSSIS 5-2.5-18.5 LF-MCG/0.5 IM SUSP
0.5000 mL | Freq: Once | INTRAMUSCULAR | Status: AC
  Administered 2012-06-09: 0.5 mL via INTRAMUSCULAR

## 2012-06-08 MED ORDER — WITCH HAZEL-GLYCERIN EX PADS: 1.0000 "application " | MEDICATED_PAD | CUTANEOUS | Status: DC | PRN

## 2012-06-08 MED ORDER — IBUPROFEN 600 MG PO TABS
600.0000 mg | ORAL_TABLET | Freq: Four times a day (QID) | ORAL | Status: DC
  Administered 2012-06-08 – 2012-06-09 (×6): 600 mg via ORAL
  Filled 2012-06-08 (×6): qty 1

## 2012-06-08 MED ORDER — ZOLPIDEM TARTRATE 5 MG PO TABS: 5.0000 mg | ORAL_TABLET | Freq: Every evening | ORAL | Status: DC | PRN

## 2012-06-08 MED ORDER — LANOLIN HYDROUS EX OINT: TOPICAL_OINTMENT | CUTANEOUS | Status: DC | PRN

## 2012-06-08 MED ORDER — ONDANSETRON HCL 4 MG/2ML IJ SOLN: 4.0000 mg | INTRAMUSCULAR | Status: DC | PRN

## 2012-06-08 MED ORDER — BENZOCAINE-MENTHOL 20-0.5 % EX AERO: 1.0000 "application " | INHALATION_SPRAY | CUTANEOUS | Status: DC | PRN

## 2012-06-08 MED ORDER — OXYCODONE-ACETAMINOPHEN 5-325 MG PO TABS
1.0000 | ORAL_TABLET | ORAL | Status: DC | PRN
  Administered 2012-06-08: 1 via ORAL
  Filled 2012-06-08: qty 1

## 2012-06-08 NOTE — Discharge Summary (Signed)
Agree with above note.  Kaitlin Koch H. 06/08/2012 3:03 PM

## 2012-06-08 NOTE — Discharge Summary (Signed)
Obstetric Discharge Summary Reason for Admission: onset of labor Prenatal Procedures: ultrasound Intrapartum Procedures: spontaneous vaginal delivery Postpartum Procedures: none Complications-Operative and Postpartum: none Hemoglobin  Date Value Range Status  06/07/2012 12.3  12.0 - 15.0 g/dL Final     HCT  Date Value Range Status  06/07/2012 36.7  36.0 - 46.0 % Final    Physical Exam:  General: alert, cooperative and no distress Lochia: appropriate, small amount Uterine Fundus: firm, -2  Incision: N/A DVT Evaluation: No evidence of DVT seen on physical exam. Negative Homan's sign. No cords or calf tenderness. No significant calf/ankle edema.  Discharge Diagnoses: Term Pregnancy-delivered  Discharge Information: Date: 06/08/2012 Activity: pelvic rest Diet: routine Medications: Ibuprofen Condition: stable Instructions: refer to practice specific booklet Discharge to: home   Newborn Data: Live born female  Birth Weight: 6 lb 9.5 oz (2990 g) APGAR: 9, 9  Home with mother.  F/U with Family Practice.  LEFTWICH-KIRBY, Berlie Persky 06/08/2012, 10:45 AM  Addendum: Pt not yet 24 hours post delivery, infant must stay.  24 hours up at midnight so pt to stay until tomorrow am.

## 2012-06-08 NOTE — Progress Notes (Signed)
Ur chart review completed.  

## 2012-06-09 NOTE — Progress Notes (Signed)
SW received consult to speak to pt about her options for birth control.  There are no funding sources for BTL or vasectomy, but condoms are free at the health department.  SW passed this information along to bedside RN and asked her to ensure that pt knows she can obtain condoms at the health department.

## 2012-06-09 NOTE — Discharge Summary (Signed)
Obstetric Discharge Summary  Postpartum Day 2  Reason for Admission: onset of labor Prenatal Procedures: none Intrapartum Procedures: spontaneous vaginal delivery Postpartum Procedures: none Complications-Operative and Postpartum: none Hemoglobin  Date Value Range Status  06/07/2012 12.3  12.0 - 15.0 g/dL Final     HCT  Date Value Range Status  06/07/2012 36.7  36.0 - 46.0 % Final    Physical Exam:  General: alert, cooperative, appears stated age and no distress CV: 2+ bilateral DP pulses PULM: Nl effort Lochia: appropriate Uterine Fundus: firm Incision: n/a DVT Evaluation: No evidence of DVT seen on physical exam. No cords or calf tenderness. No significant calf/ankle edema.  Discharge Diagnoses: Term Pregnancy-delivered  Discharge Information: Date: 06/09/2012 Activity: pelvic rest Diet: routine Medications: PNV and Ibuprofen Condition: stable Instructions: refer to practice specific booklet Discharge to: home - Pt plans to breastfeed and wants an outpatient circumcision.  - Social worker to discuss birth control options with pt.  Pt wants BTL because reported that has discussed with PCP that cannot have OCPs due to stroke risk.  Discussed this morning with pt that BTL will be expensive with no insurance.  Decided that social worker can discuss BTL and vasectomy options with pt.  Also providing birth control options in discharge instructions and will refer patient to discuss with PCP as well.  Discussed pelvic rest x 6 weeks and can make decision during that time.  Pt speaks some English and discussed this with her, also requested SW and informed that pt is Spanish-speaker in order and to nurse. Discharge after discussion with Child psychotherapist.  Follow-up Information    Follow up with MERRILL,DAVID C.. Schedule an appointment as soon as possible for a visit in 5 weeks.   Contact information:   [address] [place] Rub[place] [address] [phone]           Newborn Data: Live born female  Birth Weight: 6 lb 9.5 oz (2990 g) APGAR: 9, 9  Home with mother.  Veva, Malek 06/09/2012, 9:10 AM  I saw and examined patient along with student and agree with above note.   Domanik [place] 06/09/2012 12:32 PM

## 2012-06-09 NOTE — Discharge Summary (Signed)
Attestation of Attending Supervision of Advanced Practitioner (CNM/NP): Evaluation and management procedures were performed by the Advanced Practitioner under my supervision and collaboration.  I have reviewed the Advanced Practitioner's note and chart, and I agree with the management and plan.  Akiyah Eppolito 06/09/2012 2:11 PM

## 2012-06-11 LAB — TYPE AND SCREEN
Antibody Screen: NEGATIVE
Unit division: 0

## 2012-06-12 ENCOUNTER — Encounter: Payer: Self-pay | Admitting: Family Medicine

## 2012-06-16 NOTE — H&P (Signed)
Attestation of Attending Supervision of Advanced Practitioner: Evaluation and management procedures were performed by the PA/NP/CNM/OB Fellow under my supervision/collaboration. Chart reviewed and agree with management and plan.  Jenyfer Trawick V 06/16/2012 3:52 PM

## 2012-07-17 ENCOUNTER — Encounter: Payer: Self-pay | Admitting: Family Medicine
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
## 2012-07-24 ENCOUNTER — Encounter: Payer: Self-pay | Admitting: Family Medicine

## 2012-07-24 ENCOUNTER — Ambulatory Visit (INDEPENDENT_AMBULATORY_CARE_PROVIDER_SITE_OTHER): Payer: Self-pay | Admitting: Family Medicine

## 2012-07-24 VITALS — BP 110/71 | HR 106 | Temp 98.1°F | Ht <= 58 in | Wt 161.0 lb

## 2012-07-24 DIAGNOSIS — A5400 Gonococcal infection of lower genitourinary tract, unspecified: Secondary | ICD-10-CM

## 2012-07-24 DIAGNOSIS — IMO0002 Reserved for concepts with insufficient information to code with codable children: Secondary | ICD-10-CM

## 2012-07-24 DIAGNOSIS — O98219 Gonorrhea complicating pregnancy, unspecified trimester: Secondary | ICD-10-CM

## 2012-07-24 NOTE — Progress Notes (Signed)
Interpreter Jaeleen Inzunza Namihira for Dr Merrell 

## 2012-07-24 NOTE — Patient Instructions (Addendum)
You are doing great Please keep taking car of Jill Alexanders and your other children as you are doing now Please come back if you have any health concerns  Usted est haciendo un granPor favor, siga tomando coche de [place] y sus otros hijos como lo estn Actuary favor, vuelve si usted tiene algn problema de salud   Depresin postparto y depresin puerperal  (Postpartum Depression and Baby Blues) El perodo del postparto comienza inmediatamente despus del nacimiento del beb. Durante este tiempo, a menudo hay mucha alegra y emocin. Tambin es un tiempo de cambios considerables en la vida de [place]. Independientemente de las veces que [employer] da a luz, cada nio trae nuevos desafos y se modifica la dinmica de la familia. No es inusual tener sentimientos de emocin acompaados de cambios confusos en los estados de nimo, las emociones y los pensamientos. Todas las [employer] estn en riesgo de desarrollar depresin posparto o depresin puerperal. Estos cambios en el estado de nimo pueden ocurrir inmediatamente despus de dar a luz, o muchos meses despus. La depresin puerperal o la depresin posparto puede ser leve o grave. Adems, se puede resolver rpidamente, o puede ser un trastorno que dure [place].  CAUSAS  Los niveles elevados de hormonas y su rpido descenso son la causa principal de la depresin posparto y la depresin puerperal. Hay un conjunto de hormonas que se modifican radicalmente durante y despus del Psychiatrist. Los estrgenos y la progesterona generalmente disminuyen inmediatamente luego del [place]. Los niveles de hormona tiroidea y el cortisol y esteroides tambin disminuyen rpidamente. Entre otros factores que juegan un papel importante en estos cambios se incluyen los eventos importantes de la vida y los factores genticos.  FACTORES DE RIESGO  Si tiene cualquiera de los siguientes riesgos para la depresin posparto o depresin puerperal, sepa cules son los sntomas a  tener en cuenta durante el perodo del posparto. Los factores de riesgo que pueden aumentar la probabilidad de sufrir este trastorno son:   Antecedentes familiares o personales de depresin.  Haber sufrido depresin mientras estuvo embarazada.  Haber sufrido trastornos del estado de nimo en perodos premenstruales o con el uso de anticonceptivos orales.  Haber sufrido ests excepcional durante la vida.  Tener conflictos maritales.  No tener una red de apoyo social.  Wilburt Finlay un beb con necesidades especiales.  Tener problemas de salud tales como diabetes. SNTOMAS  Los sntomas de la depresin puerperal son:   Electronics engineer en el estado de nimo, como ir desde la extrema felicidad a la extrema tristeza.  Disminucin de Cabin crew.  Dificultad para dormir.  Episodios de llanto, ganas de llorar.  Irritabilidad.  Ansiedad. Los sntomas de la depresion postparto generalmente comienzan dentro del primer mes despus de haber dado a luz. Estos sntomas son:   Dificultad para dormirse o somnolencia excesiva.  Marcada prdida de peso.  Agitacin.  Sentimientos de Haematologist.  Falta de inters en la actividad o la comida. La psicosis puerperal es una enfermedad muy preocupante y puede ser peligrosa. Afortunadamente, es un trastorno raro. Sufrir alguno de los siguientes sntomas es motivo de atencin mdica inmediata. Los sntomas de psicosis puerperal son:   Alucinaciones e ilusiones.  Conducta bizarra o desorganizada.  Confusin o desorientacin. DIAGNSTICO  El diagnstico se realiza por medio de la evaluacin de los sntomas. No hay pruebas mdicas o de laboratorio, que llevan a un diagnstico, pero existen varios cuestionarios que un mdico puede utilizar para identificar la depresin posparto, la depresin o  la psicosis puerperal. En algunos casos se utiliza una herramienta de evaluacin denominada [place] de Depresin Postnatal de [place] para  diagnosticar este trastorno.  TRATAMIENTO  La depresin postparto por lo general desaparece por s sola en 1 a 2 semanas. El apoyo social es a menudo todo lo que se necesita. Debe ser alentada a dormir y Lawyer o suficiente. Ocasionalmente, es posible que se le administren medicamentos para ayudarla a dormir.  La depresin posparto requiere tratamiento, ya que puede durar varios meses o ms tiempo si no se trata. El [address] incluir terapia individual o de grupo, medicamentos o ambos para Radio producer frente a los Marine scientist, fisiolgicos y psicolgicos que pueden desempear un papel en la depresin. El ejercicio regular, una dieta saludable, el descanso y el apoyo social tambin puede ser muy recomendables.  La psicosis puerperal es ms grave y necesita tratamiento inmediato. A menudo es necesaria la hospitalizacin.  INSTRUCCIONES PARA EL CUIDADO EN EL HOGAR   Descanse todo lo que pueda. Tome una siesta mientras el beb duerme.  Haga ejercicios regularmente. Para algunas mujeres es beneficioso el yoga y las caminatas.  Consuma una dieta balanceada y nutritiva.  Haga pequeas cosas que disfrute hacer. Tome una taza de t, dese un bao de espuma, lea su revista favorita o escuche la msica que ms [employer].  Evite el alcohol.  Pida ayuda con las tareas [address], la cocina, las compras o [employer]. No trate de hacer todo.  Hable con la gente ms cercana a usted acerca de cmo se siente. [place] de [address], los miembros de su familia, amigos u otras madres recientes.  Trate de tener pensamientos positivos. Piense en las cosas que le resultan gratificantes.  No pase mucho tiempo sola.  Tome slo la medicacin que le indic el profesional.  Cumpla con todos los controles del postparto.  Hable con su mdico si tiene preocupaciones. SOLICITE ATENCIN MDICA SI:  Shelle Iron reaccin o problemas con su medicamento.  SOLICITE ATENCIN MDICA DE INMEDIATO SI:   Tiene ideas  suicidas.  Siente que puede daar al beb o a Engineer, maintenance (IT). Document Released: 02/19/2008 Document Revised: 11/25/2011 [facility] Patient Information 2013 [place], [place].

## 2012-07-27 ENCOUNTER — Encounter: Payer: Self-pay | Admitting: Family Medicine

## 2012-07-27 NOTE — Assessment & Plan Note (Signed)
Likely false positive as husband was negative after testing at HD, and pt second swab was negative prior to treatment

## 2012-07-27 NOTE — Progress Notes (Signed)
.  postpartum Subjective:     Kaitlin Koch is a 26 y.o. female who presents for a postpartum visit. She is 6 week postpartum following a spontaneous vaginal delivery. I have fully reviewed the prenatal and intrapartum course. The delivery was at 39.3 gestational weeks. Outcome: spontaneous vaginal delivery. Anesthesia: none. Postpartum course has been uneventful. Baby is feeding by breast. Bleeding no bleeding. Bowel function is normal. Bladder function is normal. Patient is not sexually active. Contraception method is none. Postpartum depression screening: negative.  The following portions of the patient's history were reviewed and updated as appropriate: allergies, current medications, past family history, past medical history, past social history, past surgical history and problem list.  Review of Systems Pertinent items are noted in HPI.   Objective:    BP 110/71  Pulse 106  Temp 98.1 F (36.7 C) (Oral)  Ht 4\' 10"  (1.473 m)  Wt 161 lb (73.029 kg)  BMI 33.65 kg/m2  General:  alert, cooperative and appears stated age   Breasts:  Actively breast feeding during visit. Milk production bilaterally. No soreness  Lungs: clear to auscultation bilaterally  Heart:  regular rate and rhythm, S1, S2 normal, no murmur, click, rub or gallop        Assessment:    6 wk postpartum exam. Pap smear not done at today's visit.   Plan:    1. Contraception: abstinence 2. Low concern for post partum depression. Pt w/ good support system at home.  3. No concern for STD. Previous GC+ test likely false positive as husband was also checked at HD and was negative. 3. Follow up as needed

## 2012-08-11 ENCOUNTER — Telehealth: Payer: Self-pay | Admitting: Clinical

## 2012-08-11 NOTE — Telephone Encounter (Signed)
Clinical Child psychotherapist (CSW) received a call from pt today that she will try to make her appointment with CSW tomorrow due to the weather. CSW encouraged pt to be safe and contact CSW next week. Pt appreciative.  Theresia Bough, MSW, Theresia Majors [phone]

## 2012-08-11 NOTE — Telephone Encounter (Signed)
Clinical Child psychotherapist (CSW) received a call from pt today

## 2013-06-28 IMAGING — US US OB TRANSVAGINAL
1 series · 14 of 28 positions shown · non-contrast
Comparison: none

[Series 1: us ob transvaginal · 36 acquisitions, 14 frames shown]
[im 2/36]
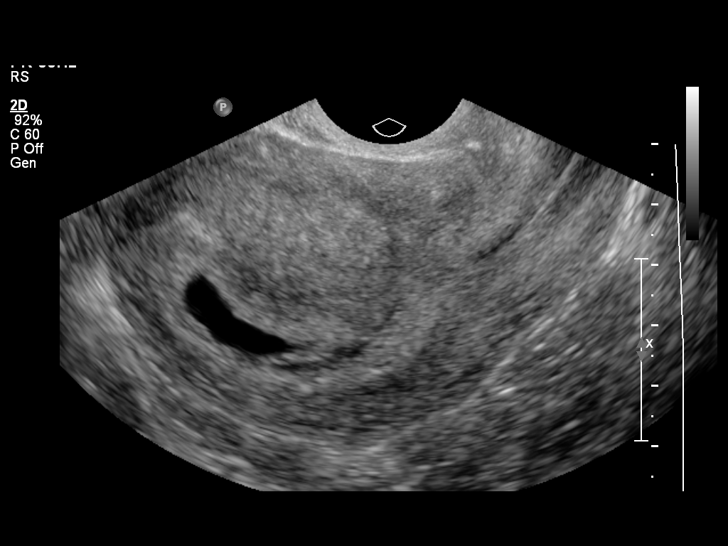
[im 4/36]
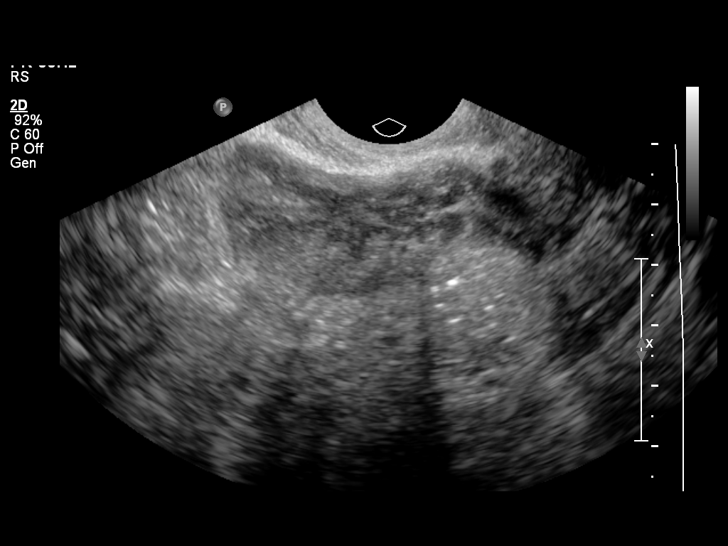
[im 7/36]
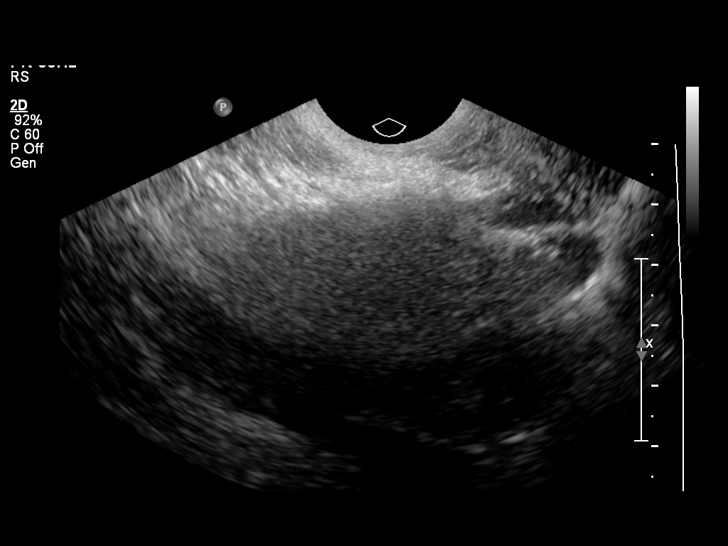
[im 10/36]
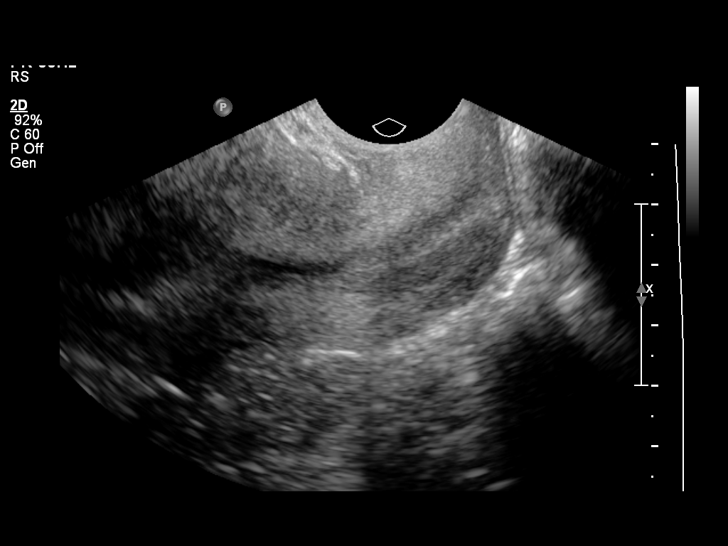
[im 12/36]
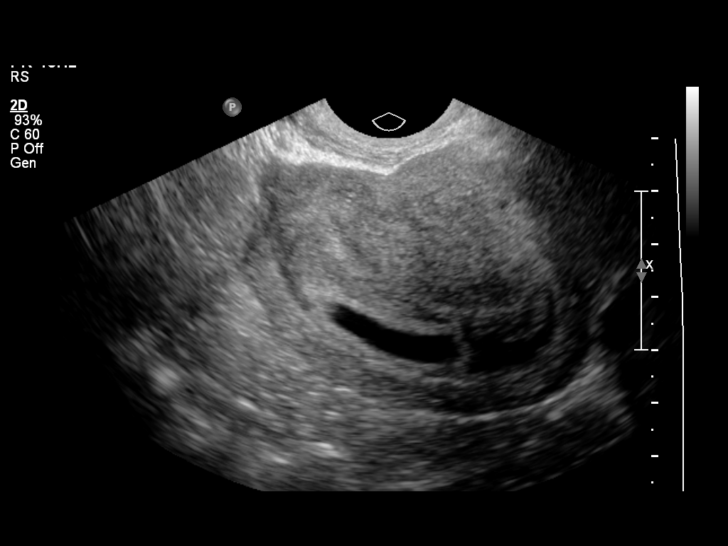
[im 15/36]
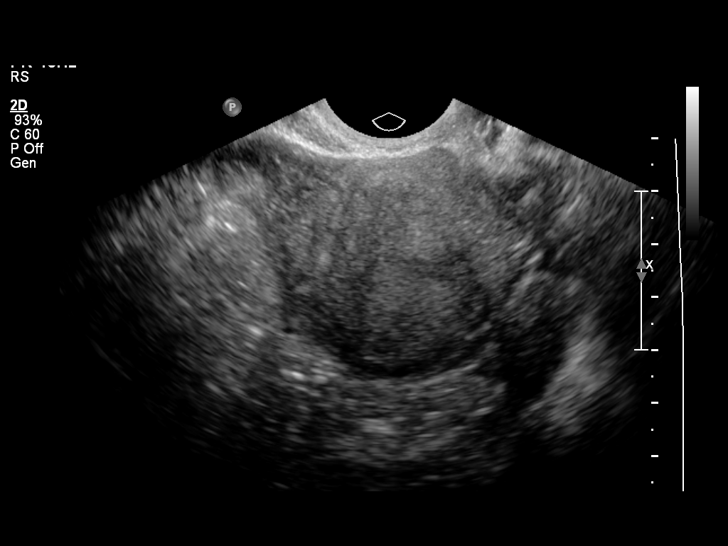
[im 17/36]
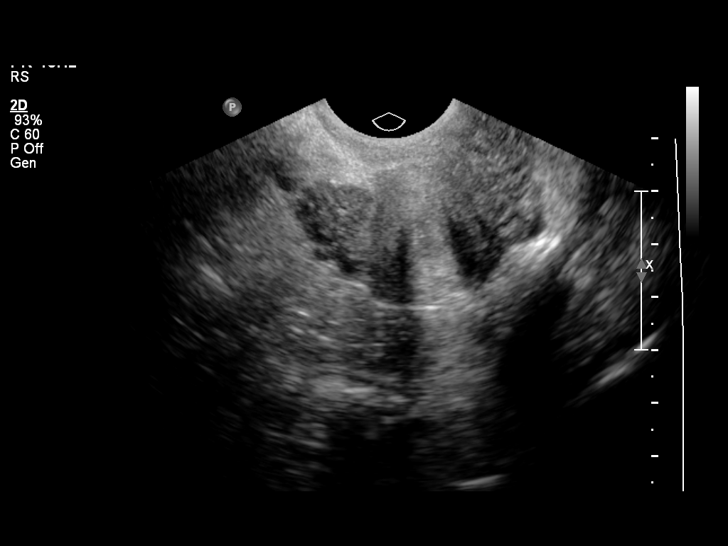
[im 20/36]
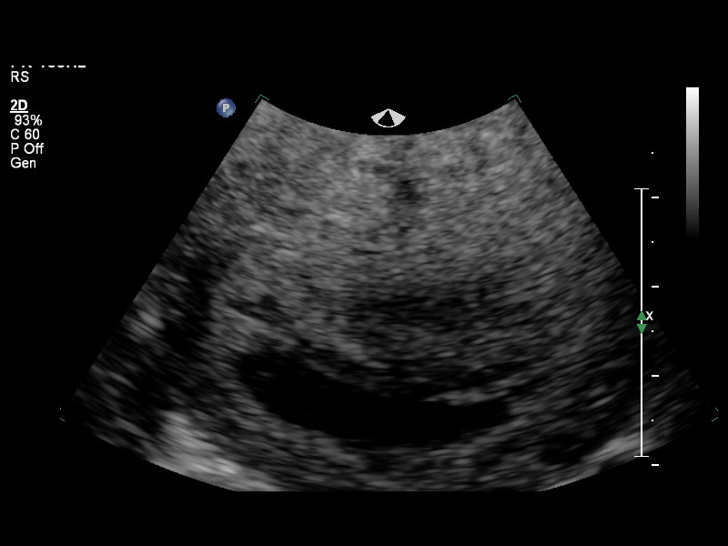
[im 23/36]
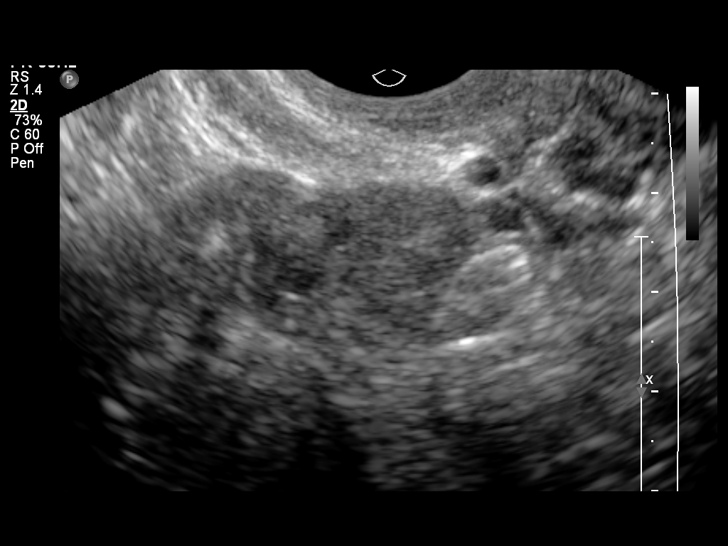
[im 25/36]
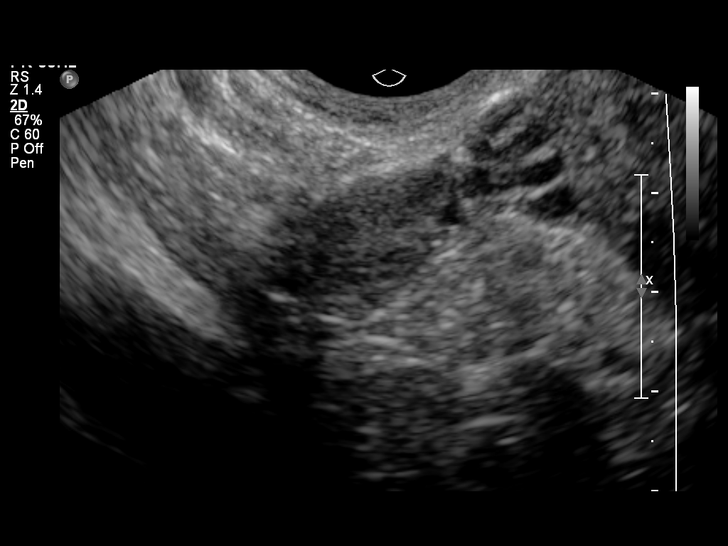
[im 28/36]
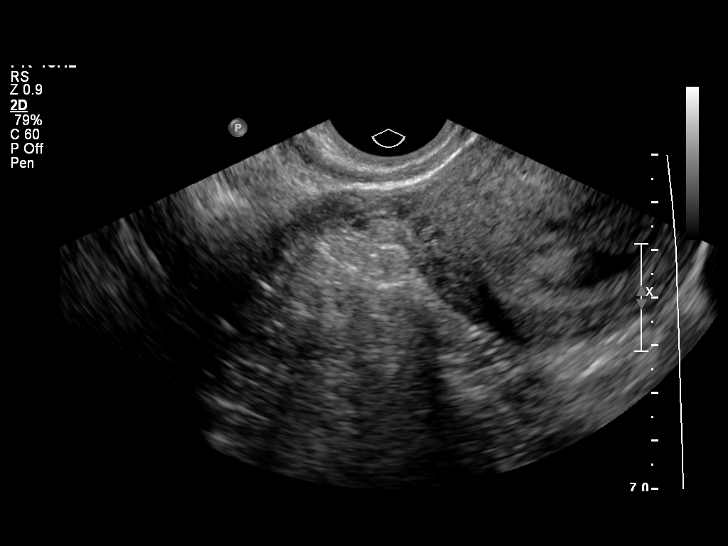
[im 30/36]
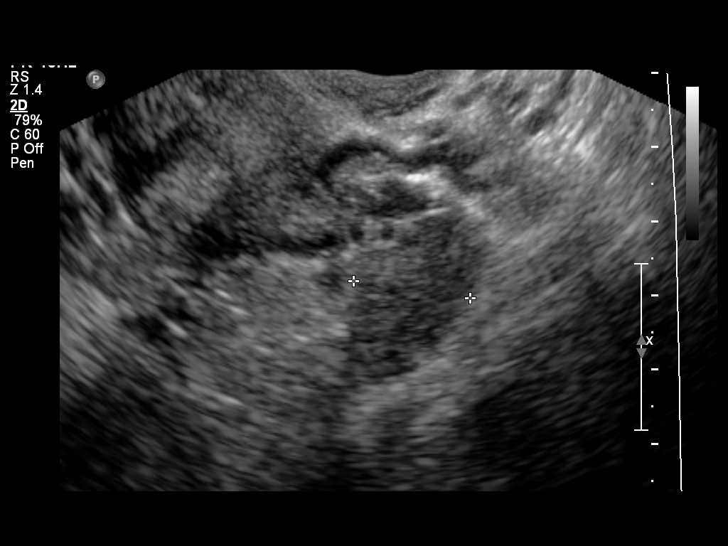
[im 33/36]
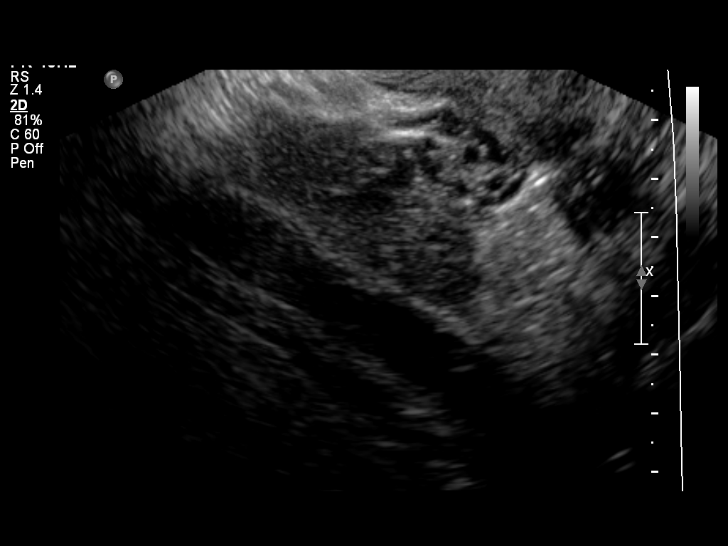
[im 36/36]
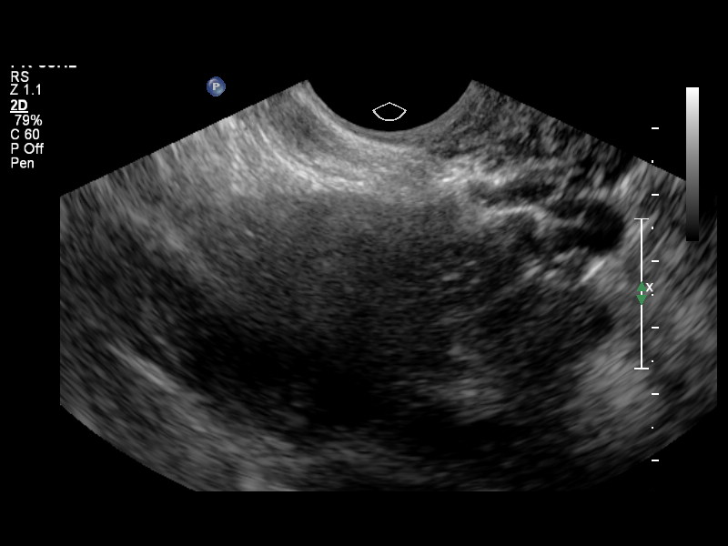

[14 of 28 positions shown; findings below may reference images not displayed]

OBSTETRICS REPORT
                      (Signed Final 06/25/2011 [DATE])

                 MAU/Triage
Procedures

 US OB TRANSVAGINAL                                    76817.0
Indications

 Attempted TAB using Cytotec
 Assess viability
Fetal Evaluation

 Preg. Location:    Intrauterine
 Gest. Sac:         Intrauterine, moderate
                    subchorion bleed
 Yolk Sac:          Not visualized
 Fetal Pole:        Not visualized
 Cardiac Activity:  No embryo visualized
Gestational Age

 Best:          7w 6d      Det. By:  U/S C R L   (06/10/11)   EDD:   02/05/12
Cervix Uterus Adnexa

 Cervix:       Closed.
 Uterus:       No abnormality visualized.
 Cul De Sac:   No free fluid seen.
 Left Ovary:    Within normal limits.
 Right Ovary:   Within normal limits.

 Adnexa:     No abnormality visualized.
Impression

 There is persistence of an empty intrauterine GS.  A
 moderated sized subacute subchorionic bleed is also
 present.

## 2013-07-06 IMAGING — US US OB TRANSVAGINAL
1 series · 14 of 28 positions shown · non-contrast
Comparison: 06/25/2011.

CLINICAL DATA: Evaluate for retained products of conception.

TRANSVAGINAL OB ULTRASOUND
TECHNIQUE: Transvaginal ultrasound was performed for evaluation of
the gestation as well as the maternal uterus and adnexal regions.

[Series 1: us ob follow up · 34 acquisitions, 14 frames shown]
[im 2/34]
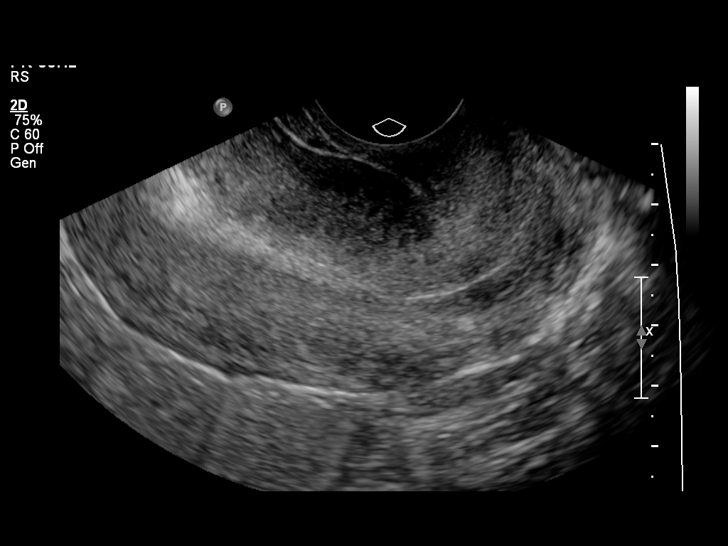
[im 4/34]
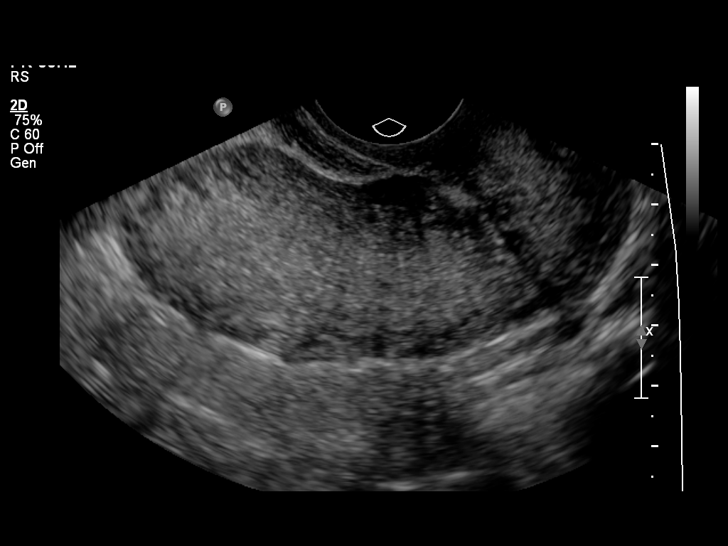
[im 7/34]
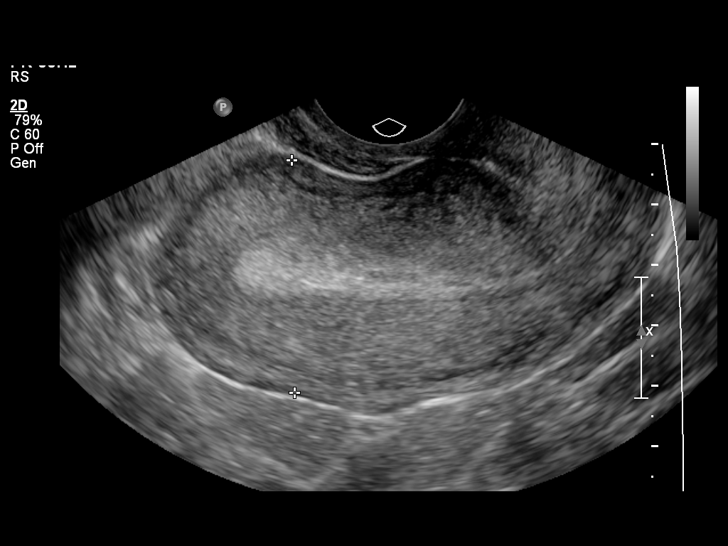
[im 9/34]
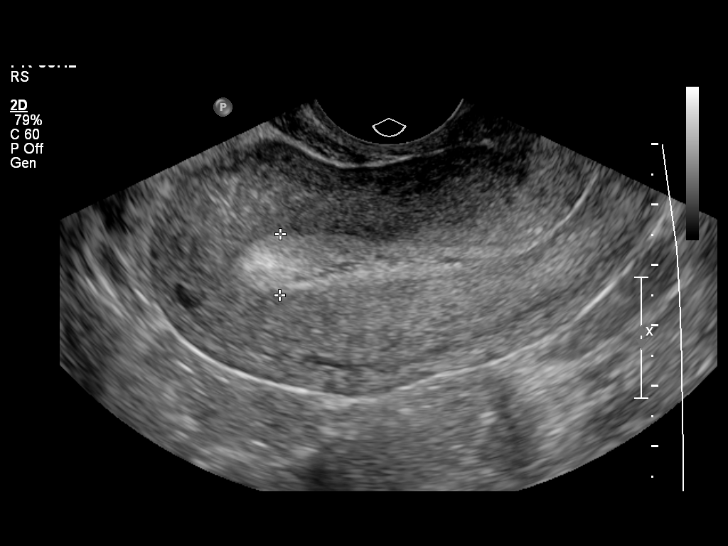
[im 12/34]
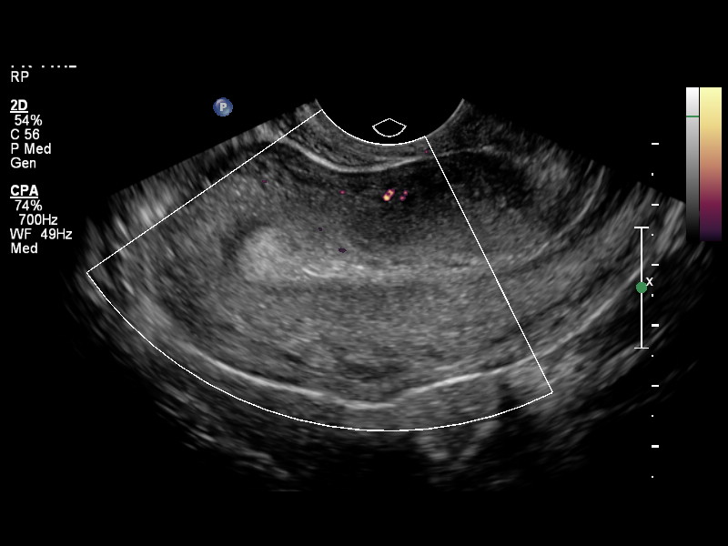
[im 14/34]
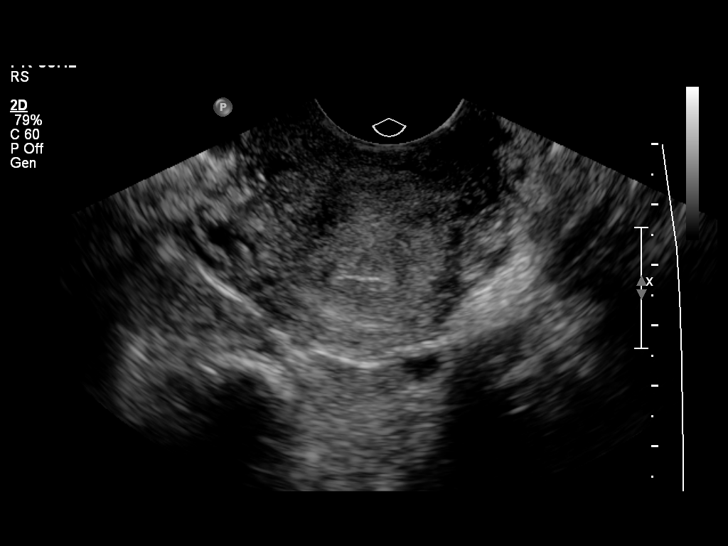
[im 16/34]
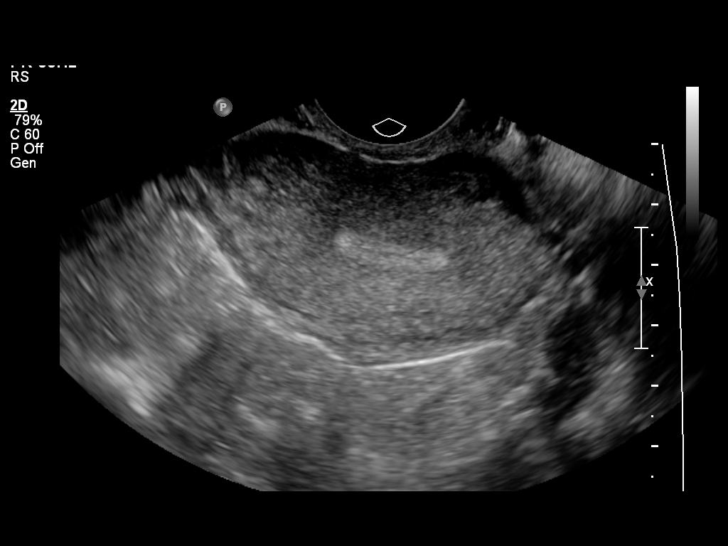
[im 19/34]
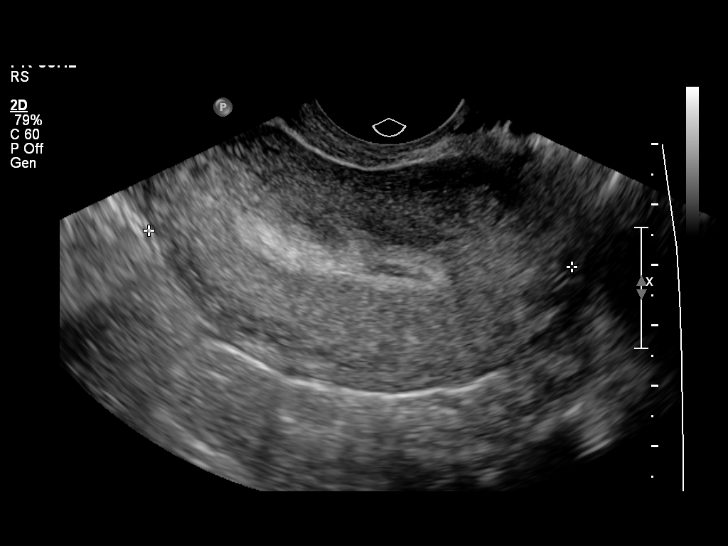
[im 21/34]
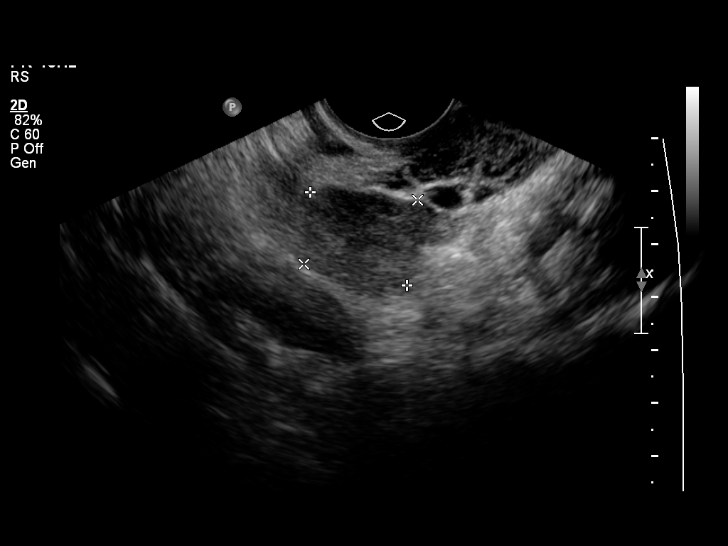
[im 24/34]
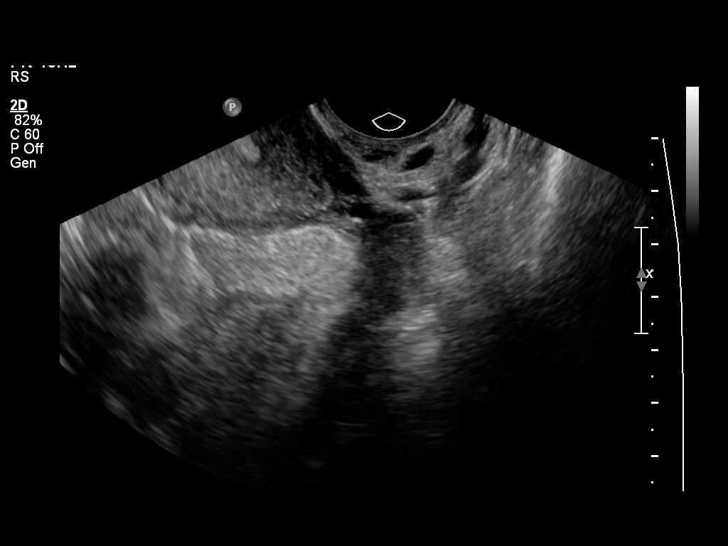
[im 26/34]
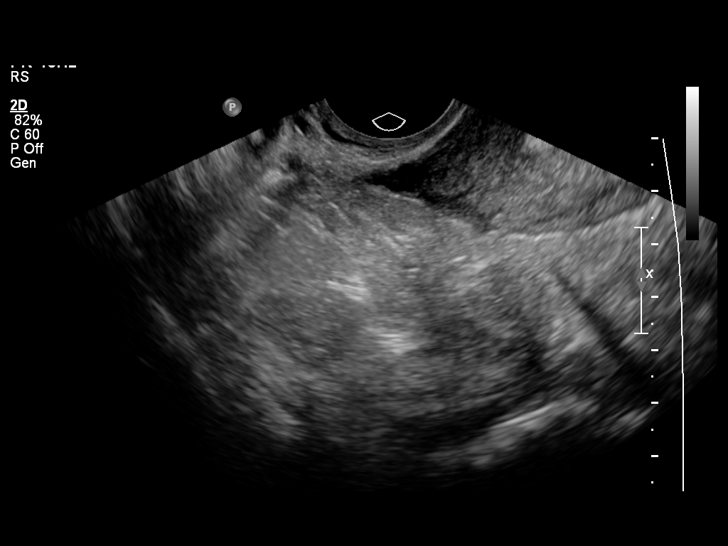
[im 29/34]
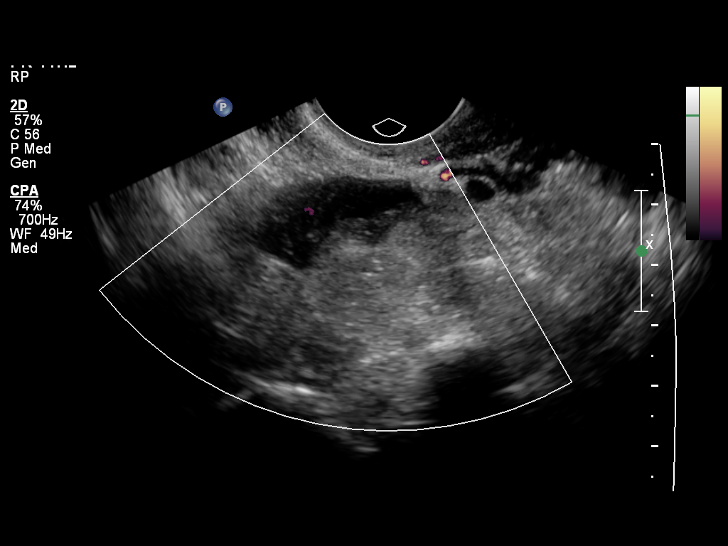
[im 31/34]
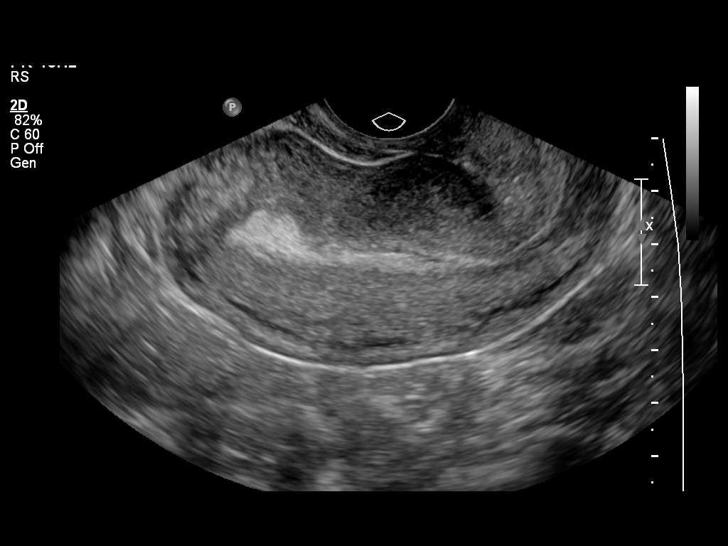
[im 34/34]
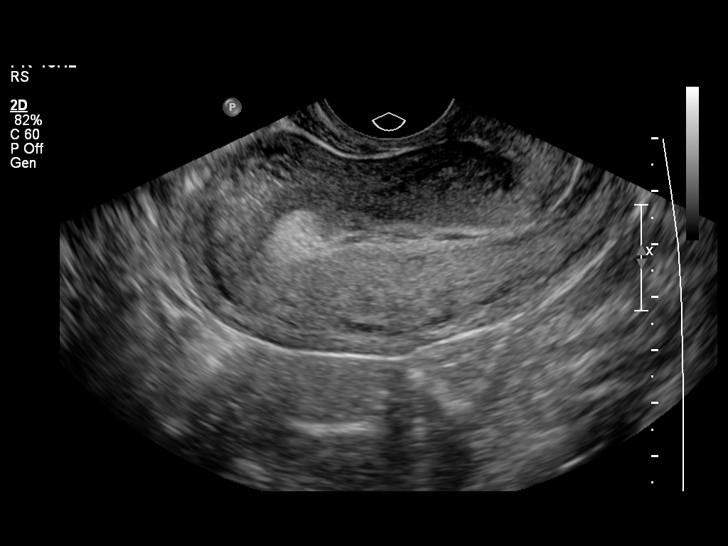

[14 of 28 positions shown; findings below may reference images not displayed]

FINDINGS: There is no intrauterine gestational sac.  Endometrium is
somewhat irregular and heterogeneous in appearance, measuring 10 mm
in thickness.  No internal blood flow.  Ovaries are unremarkable.
No free fluid.
IMPRESSION: Mild endometrial irregularity and heterogeneity without internal
flow.  While this may represent blood products and/or fluid,
retained products of conception cannot be definitively excluded.

## 2013-09-30 ENCOUNTER — Ambulatory Visit: Payer: Self-pay | Admitting: Emergency Medicine

## 2013-09-30 VITALS — BP 108/62 | HR 70 | Temp 99.2°F | Resp 18 | Ht 58.5 in | Wt 166.0 lb

## 2013-09-30 DIAGNOSIS — G510 Bell's palsy: Secondary | ICD-10-CM
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
## 2013-09-30 NOTE — Patient Instructions (Signed)
Parálisis de Bell  (Bell's Palsy)  La parálisis de Bell es un trastorno en el que los músculos de un lado de la cara no pueden moverse (parálisis). Esto se debe a que los nervios de la cara están paralizados. Se cree que es producido por un virus. El virus causa inflamación del nervio que controla los movimientos de un lado de la cara. El nervio pasa a través de un espacio angosto, rodeado de hueso. Cuando el nervio se inflama, el hueso puede comprimirlo. Esto da como resultado un daño a la cubierta protectora que rodea el nervio. Este daño interfiere con la forma en la que el nervio se comunica con los músculos de la cara. Como resultado, puede causar debilidad o parálisis en los músculos faciales.   Las lesiones (traumatismos), tumores y cirugía pueden causar parálisis de Bell, pero la mayoría de las veces se desconoce la causa. Es un trastorno bastante frecuente. Comienza de repente (aparición abrupta) y la parálisis por lo general desaparece en 2 días. La parálisis de Bell no es peligrosa. Pero como el ojo no se cierra adecuadamente, necesitará tomar algunos recaudos para que no se seque. Esto puede incluir un vendaje (mantener el ojo cerrado) o humedecerlo con lágrimas artificiales. Rara vez ocurre en ambos lados al mismo tiempo.  SÍNTOMAS  · Ceja caída.  · Caída del ojo y comisura de la boca.  · Incapacidad para cerrar un ojo.  · Pérdida del sentido del gusto en la parte anterior de la lengua.  · Sensibilidad a ruidos fuertes.  TRATAMIENTO  El tratamiento por lo general no es quirúrgico. Si el paciente fue atendido dentro de las primeras 24 a 48 horas puede que se le haya prescrito un tratamiento corto con esteroides para intentar acortar el curso de la enfermedad. También se podrán utilizar medicamentos antivirales junto con los esteroides, pero no está claro si son útiles.   Necesitará proteger el ojo si no puede cerrarlo. La córnea (cubierta transparente del ojo) podría secarse y dañarse. Se podrán utilizar  lágrimas artificiales para mantener el ojo lubricado. Se deberán utilizar lentes o parches para proteger el ojo.  PRONÓSTICO  El tiempo de recuperación es variable y puede tardar desde algunos días a varios meses. Aunque en general se cura completamente, (en un 80% de los casos aproximadamente), las consecuencias no pueden predecirse. La mayoría de las personas mejoran dentro de las 3 semanas del comienzo de los síntomas. Las mejoras deberán continuar por entre 3 y 6 meses. Una pequeña cantidad de personas presentan debilidad moderada a grave que es permanente.   INSTRUCCIONES PARA EL CUIDADO DOMICILIARIO  · Si su médico le prescribe medicamentos para controlar la inflamación del nervio, tómela de la manera indicada. No suspenda los medicamentos a menos que se lo haya indicado el profesional que le asiste.  · Utilice gotas oculares para humedecer los ojos y prevenir la sequedad, de la manera en que se le indique.  · Proteja sus ojos de la manera en que se lo indique el profesional que le asiste.  · Practique masajes faciales y ejercicios de la manera que se lo indique el profesional que le asiste.  · Realice sus actividades normales y procure un descanso regular.  SOLICITE ATENCIÓN MÉDICA DE INMEDIATO SI:  · Presenta dolor, enrojecimiento o irritación en el ojo.  · Usted o su niño tienen una temperatura oral de más de 38,9° C (102° F) y no puede controlarla con medicamentos.  ESTÉ SEGURO QUE:   · Comprende las instrucciones   para el alta médica.  · Controlará su enfermedad.  · Solicitará atención médica de inmediato según las indicaciones.  Document Released: 09/02/2005 Document Revised: 11/25/2011  ExitCare® Patient Information ©2014 [employer].

## 2014-04-21 ENCOUNTER — Ambulatory Visit: Payer: Medicaid Other

## 2015-01-01 ENCOUNTER — Emergency Department (HOSPITAL_COMMUNITY)
Admission: EM | Admit: 2015-01-01 | Discharge: 2015-01-01 | Disposition: A | Discharge status: Home / Self Care | Payer: Medicaid Other | Attending: Emergency Medicine | Admitting: Emergency Medicine

## 2015-01-01 ENCOUNTER — Encounter (HOSPITAL_COMMUNITY): Payer: Self-pay | Admitting: *Deleted

## 2015-01-01 DIAGNOSIS — K0889 Other specified disorders of teeth and supporting structures: Secondary | ICD-10-CM

## 2015-01-01 DIAGNOSIS — Z79899 Other long term (current) drug therapy: Secondary | ICD-10-CM | POA: Insufficient documentation

## 2015-01-01 DIAGNOSIS — K088 Other specified disorders of teeth and supporting structures: Secondary | ICD-10-CM | POA: Insufficient documentation

## 2015-01-01 MED ORDER — TRAMADOL HCL 50 MG PO TABS
50.0000 mg | ORAL_TABLET | Freq: Once | ORAL | Status: AC
  Administered 2015-01-01: 50 mg via ORAL
  Filled 2015-01-01: qty 1

## 2015-01-01 MED ORDER — ACETAMINOPHEN 325 MG PO TABS
325.0000 mg | ORAL_TABLET | Freq: Once | ORAL | Status: AC
Start: 2015-01-01 — End: 2015-01-01
  Administered 2015-01-01: 325 mg via ORAL
  Filled 2015-01-01: qty 1

## 2015-01-01 MED ORDER — HYDROCODONE-ACETAMINOPHEN 5-325 MG PO TABS: 1.0000 | ORAL_TABLET | ORAL | Status: DC | PRN

## 2015-01-01 NOTE — ED Notes (Signed)
Patient states she is having pain to her left front tooth.  States she was at the dentist 3 days ago and was given prescription for Penicillin and Ibuprofen but has not had any relief

## 2015-01-01 NOTE — ED Provider Notes (Signed)
CSN: [id number]     Arrival date & time 01/01/15  0534 History   First MD Initiated Contact with Patient 01/01/15 0602     Chief Complaint  Patient presents with  . Dental Pain     (Consider location/radiation/quality/duration/timing/severity/associated sxs/prior Treatment) Patient is a 29 y.o. female presenting with tooth pain. The history is provided by the patient and medical records.  Dental Pain   This is a 29 y.o. F with no significant PMH presenting to the ED for dental pain.  Patient states for the past week she has been having issues and pain in her left central incisor.  She states she was seen by her dentist 3 days ago and started on penicillin and motrin but she has not had any relief thus far.  She denies any fever, chills, sweats.  States pain is throbbing in nature, sharp if food or drink touches her tooth.  Pain is preventing her from sleeping at night.  VSS.  History reviewed. No pertinent past medical history. Past Surgical History  Procedure Laterality Date  . No past surgeries     Family History  Problem Relation Age of Onset  . Depression Neg Hx   . Other Neg Hx    History  Substance Use Topics  . Smoking status: Never Smoker   . Smokeless tobacco: Never Used  . Alcohol Use: No   OB History    Gravida Para Term Preterm AB TAB SAB Ectopic Multiple Living   Review of Systems  HENT: Positive for dental problem.   All other systems reviewed and are negative.     Allergies  Prenatal vitamins  Home Medications   Prior to Admission medications   Medication Sig Start Date End Date Taking? Authorizing Provider  acetaminophen (TYLENOL) 325 MG tablet Take 325 mg by mouth every 6 (six) hours as needed. pain    Historical Provider, MD  ibuprofen (ADVIL,MOTRIN) 600 MG tablet Take 1 tablet (600 mg total) by mouth every 6 (six) hours. 06/08/12   Hurshel Party, CNM  Prenatal Vit-Fe Fumarate-FA (PRENATAL MULTIVITAMIN) TABS Take 1  tablet by mouth daily.    Historical Provider, MD   BP 154/85 mmHg  Pulse 75  Temp(Src) 97.8 F (36.6 C) (Oral)  Resp 20  SpO2 99%  LMP 12/21/2014   Physical Exam  Constitutional: She is oriented to person, place, and time. She appears well-developed and well-nourished.  HENT:  Head: Normocephalic and atraumatic.  Mouth/Throat: Oropharynx is clear and moist.    Teeth largely in good dentition, left central incisor TTP, surrounding gingiva normal in appearance without signs of infection or dental abscess, no facial or neck swelling, handling secretions appropriately, no trismus  Eyes: Conjunctivae and EOM are normal. Pupils are equal, round, and reactive to light.  Neck: Normal range of motion. Neck supple.  Cardiovascular: Normal rate, regular rhythm and normal heart sounds.   Pulmonary/Chest: Effort normal and breath sounds normal. No respiratory distress. She has no wheezes.  Abdominal: Soft. Bowel sounds are normal. There is no tenderness. There is no guarding.  Musculoskeletal: Normal range of motion.  Neurological: She is alert and oriented to person, place, and time.  Skin: Skin is warm and dry.  Psychiatric: She has a normal mood and affect.  Nursing note and vitals reviewed.   ED Course  Procedures (including critical care time) Labs Review Labs Reviewed - No data to display  Imaging  Review No results found.   EKG Interpretation None      MDM   Final diagnoses:  Pain, dental   Dental pain without signs of infection/dental abscess. She has no facial or neck swelling to suggest Ludwig's angina. She is afebrile and nontoxic in appearance. Will have patient continue penicillin from dentist, will add Vicodin for pain relief. She is to follow-up with her dentist for further management.  Discussed plan with patient, he/she acknowledged understanding and agreed with plan of care.  Return precautions given for new or worsening symptoms.  Garlon HatchetLisa M Ree Alcalde, PA-C 01/01/15  [id number]  Derwood KaplanAnkit Nanavati, MD 01/01/15 [phone]

## 2015-01-01 NOTE — ED Notes (Signed)
Discharge instructions and prescriptions reviewed 

## 2015-01-01 NOTE — Discharge Instructions (Signed)
Take the prescribed medication as directed. °Follow-up with your dentist. °Return to the ED for new or worsening symptoms. ° °

## 2019-11-15 LAB — CYTOLOGY - PAP: Pap: NEGATIVE

## 2020-04-24 LAB — OB RESULTS CONSOLE HGB/HCT, BLOOD
HCT: 32 (ref 29–41)
Hemoglobin: 10.7

## 2020-04-24 LAB — HEPATITIS C ANTIBODY: HCV Ab: NEGATIVE

## 2020-04-24 LAB — GLUCOSE, 1 HOUR GESTATIONAL: Glucose, 1 Hour GTT: 88

## 2020-04-24 LAB — CYSTIC FIBROSIS DIAGNOSTIC STUDY: Interpretation-CFDNA:: NEGATIVE

## 2020-04-24 LAB — OB RESULTS CONSOLE VARICELLA ZOSTER ANTIBODY, IGG: Varicella: IMMUNE

## 2020-04-24 LAB — OB RESULTS CONSOLE HEPATITIS B SURFACE ANTIGEN: Hepatitis B Surface Ag: NEGATIVE

## 2020-04-24 LAB — OB RESULTS CONSOLE GC/CHLAMYDIA
Chlamydia: NEGATIVE
Gonorrhea: NEGATIVE

## 2020-04-24 LAB — OB RESULTS CONSOLE RUBELLA ANTIBODY, IGM: Rubella: IMMUNE

## 2020-04-24 LAB — OB RESULTS CONSOLE HIV ANTIBODY (ROUTINE TESTING): HIV: NONREACTIVE

## 2020-04-24 LAB — OB RESULTS CONSOLE RPR
RPR: NONREACTIVE
RPR: NONREACTIVE

## 2020-04-24 LAB — OB RESULTS CONSOLE ANTIBODY SCREEN: Antibody Screen: NEGATIVE

## 2020-04-24 LAB — OB RESULTS CONSOLE ABO/RH: RH Type: POSITIVE

## 2020-04-24 LAB — OB RESULTS CONSOLE PLATELET COUNT: Platelets: 373

## 2020-05-01 ENCOUNTER — Other Ambulatory Visit: Payer: Self-pay

## 2020-05-01 ENCOUNTER — Ambulatory Visit: Payer: Self-pay

## 2020-05-01 ENCOUNTER — Ambulatory Visit: Payer: Self-pay | Attending: Obstetrics and Gynecology | Admitting: Genetic Counselor

## 2020-05-01 ENCOUNTER — Ambulatory Visit: Payer: Self-pay | Admitting: Family Medicine

## 2020-05-01 DIAGNOSIS — Z3A23 23 weeks gestation of pregnancy: Secondary | ICD-10-CM

## 2020-05-01 DIAGNOSIS — Z31430 Encounter of female for testing for genetic disease carrier status for procreative management: Secondary | ICD-10-CM

## 2020-05-01 DIAGNOSIS — Z8759 Personal history of other complications of pregnancy, childbirth and the puerperium: Secondary | ICD-10-CM

## 2020-05-01 DIAGNOSIS — Z3A00 Weeks of gestation of pregnancy not specified: Secondary | ICD-10-CM | POA: Insufficient documentation

## 2020-05-01 DIAGNOSIS — Z315 Encounter for genetic counseling: Secondary | ICD-10-CM

## 2020-05-01 DIAGNOSIS — O09522 Supervision of elderly multigravida, second trimester: Secondary | ICD-10-CM | POA: Insufficient documentation

## 2020-05-01 NOTE — Progress Notes (Signed)
05/01/2020  Kaitlin Koch 1986/06/19 MRN: [id number] DOV: 05/01/2020  Kaitlin Koch presented to the [facility] for a genetics consultation regarding advanced maternal age. Kaitlin Koch was accompanied to her appointment by her partner, Kaitlin Koch. This session was facilitated by a [facility] Spanish interpreter.   Indication for genetic counseling - Advanced maternal age  Prenatal history  Kaitlin Koch is a [id number], 34 y.o. female. Her current pregnancy has completed [redacted]w[redacted]d (Estimated Date of Delivery: 09/15/20). Kaitlin Koch has a 40 year old son, a 33 year old daughter, and an 43 year old son from prior relationships. She has also had two miscarriages with past partners. The current pregnancy is her first with this partner.  Kaitlin Koch denied exposure to environmental toxins or chemical agents. She denied the use of street drugs. She reported smoking cigarettes and drinking alcohol in April before she knew she was pregnant. She has not smoked or drank alcohol since then. She reported taking aspirin as she hypertension during her pregnancy with her third child. She denied significant viral illnesses, fevers, and bleeding during the course of her pregnancy. Her medical and surgical histories were noncontributory.  Family History  A three generation pedigree was drafted and reviewed. The family history is remarkable for the following:  - Kaitlin Koch has a maternal half sister whose son has seizures and visual impairment. He also had motor and speech delays. The father of Kaitlin Koch half sister reportedly has a sister with "the same condition" as her nephew. Given that her half sister's father is not related to Kaitlin Koch by blood, risk of recurrence is likely low. However, a more accurate risk assessment could be provided if the underlying cause of this condition is known.  - Kaitlin Koch's partner Kaitlin Koch  has a sister whose child was born missing two fingers. Kaitlin Koch also has a paternal half aunt whose son was born with a cleft lip. The developmental and medical histories of these individuals are otherwise noncontributory. We discussed that these are two forms of birth defects. We discussed that 3-5% of all pregnancies in the [place] are affected by some sort of birth defect. Isolated birth defects often occur due to a combination of genetic, lifestyle, and environmental factors that are difficult to identify. Given the family history, the couple's children could possibly have an increased chance of being affected by a birth defect. However, precise risk assessment is limited.   - Kaitlin Koch reportedly has seizure-like episodes in her sleep. She has seen a doctor for this, but has not been officially diagnosed with anything and the cause of these events remains unknown. Without further details, risk assessment is limited.  - Kaitlin Koch has a maternal aunt who had a twin pregnancy. One of the twins is healthy, and the other was stillborn. The cause of this stillbirth is unknown; thus, risk assessment is limited.  - Kaitlin Koch has a paternal half aunt who is "deaf and mute". We discussed that there are several possible explanations for hearing loss, including genetic conditions or errors in embryonal/fetal development. The precise etiology for hearing loss in the family is unknown. However, literature indicates that up to 80% of congenital hearing loss is caused by genetic factors. There are various possible patterns of inheritance associated with hearing loss. Precise risk of recurrence would depend on the underlying inheritance pattern if the hearing loss in Kaitlin family has a genetic cause.  The remaining family histories were reviewed and found to be noncontributory for  birth defects, intellectual disability, recurrent pregnancy loss, and known genetic conditions. Kaitlin Koch had limited  information about her paternal family history; thus, risk assessment was limited.  The patient's ethnicity is [place]. The father of the pregnancy's ethnicity is [place]. Ashkenazi Jewish ancestry and consanguinity were denied. Pedigree will be scanned under Media.  Discussion  Advanced maternal age:  Kaitlin Koch was referred to genetic counseling for advanced maternal age, as she will be 34 years old at the time of delivery. We discussed that as a woman ages, the risk for certain chromosomal conditions, such as trisomy 66 (Down syndrome), trisomy 46, and trisomy 18 increases. These conditions often are not inherited, but instead occur due to an error in chromosomal division during the formation of sperm and egg cells in a process called nondisjunction. At her age and during the second trimester, Kaitlin Koch has approximately a 1 in 141 (0.7%) chance of having a child with a chromosomal aneuploidy. Her age-related risk to have a child with Down syndrome specifically is 1 in 310 (0.3%) in the second trimester. We briefly reviewed features associated with Down syndrome, trisomy 7, and trisomy 18.    We reviewed noninvasive prenatal screening (NIPS) as an available screening option for chromosomal aneuploidies. Specifically, we discussed that NIPS analyzes cell free DNA originating from the placenta that is found in the maternal blood circulation during pregnancy. This test is not diagnostic for chromosome conditions, but can provide information regarding the presence or absence of extra fetal DNA for chromosomes 13, 18 21, and the sex chromosomes. Thus, it would not identify or rule out all fetal aneuploidy. The reported detection rate is 91-99% for risomies 21, 18, 13, and sex chromosome aneuploidies. The false positive rate is reported to be less than 0.1% for any of these conditions. Kaitlin Koch indicated that she is interested in pursuing NIPS.    Carrier screening:  Per  ACOG recommendation, carrier screening for hemoglobinopathies, cystic fibrosis (CF) and spinal muscular atrophy (SMA) was discussed including information about the conditions, rationale for testing, autosomal recessive inheritance, and the option of prenatal diagnosis. Per records, it appears that carrier screening for CF and hemoglobinopathies was ordered by Ms. Heber Truro OBGYN provider. However, these records were not available for my review today. I offered her additional carrier screening for SMA, which Kaitlin Koch accepted today. Kaitlin Koch was informed that select hemoglobinopathies, CF, and SMA are included on [place] [facility]'s newborn screen.   Diagnostic testing:  Kaitlin Koch was also counseled regarding diagnostic testing via amniocentesis. We discussed the technical aspects of the procedure and quoted up to a 1 in 500 (0.2%) risk for spontaneous pregnancy loss or other adverse pregnancy outcomes as a result of amniocentesis. Cultured cells from an amniocentesis sample allow for the visualization of a fetal karyotype, which can detect >99% of chromosomal aberrations. Chromosomal microarray can also be performed to identify smaller deletions or duplications of fetal chromosomal material. After careful consideration, Kaitlin Koch declined amniocentesis at this time. She understands that amniocentesis is available at any point after 16 weeks of pregnancy and that she may opt to undergo the procedure at a later date should she change her mind.  Plan:  Kaitlin Koch had her blood drawn for noninvasive prenatal screening (NIPS) and SMA carrier screening through the laboratory [facility] today, as she qualifies for free testing through their Patient Assistance Program. Results from NIPS will take approximately 1 week to be returned. Results from SMA carrier screening will take approximately 2-3 weeks to  be returned. I will call Kaitlin Koch once results become  available.  I counseled Kaitlin Koch regarding the above risks and available options. The approximate face-to-face time with the genetic counselor was 60 minutes.  In summary:  Discussed advanced maternal age and options for follow-up testing  0.3-0.7% chance to have child with chromosomal aneuploidy in 2nd trimester at age 65  Opted to undergo [facility] NIPS. We will follow results  Discussed carrier screening for cystic fibrosis, spinal muscular atrophy, and hemoglobinopathies  Carrier screening for CF and hemoglobinopathies ordered by OBGYN provider (results not available for my review)  Opted to have carrier screening for SMA. We will follow results  Offered additional testing and screening  Declined amniocentesis  Reviewed family history concerns   Gershon Crane, MS, Aeronautical engineer

## 2020-05-08 ENCOUNTER — Telehealth: Payer: Self-pay | Admitting: Genetic Counselor

## 2020-05-08 NOTE — Telephone Encounter (Signed)
I called Ms. Kaitlin Koch with the help of [address] [place], [place] [address] to discuss her negative noninvasive prenatal screening (NIPS)/cell free DNA (cfDNA) testing result. Specifically, Ms. Kaitlin Koch had testing through the laboratory [facility]. Testing was offered because of advanced maternal age. These negative results demonstrated an expected representation of chromosome 21, 18, 13, and sex chromosome material, greatly reducing the likelihood of trisomies 21, 92, or 36 and sex chromosome aneuploidies for the pregnancy. Ms. Kaitlin Koch requested to know about the expected fetal sex, which is female.  NIPS analyzes placental (fetal) DNA in maternal circulation. NIPS is considered to be highly specific and sensitive, but is not considered to be a diagnostic test. We reviewed that this testing identifies 91-99% of pregnancies with trisomies 73, 98, and 56, as well as sex chromosome abnormalities, but does not test for all genetic conditions. Diagnostic testing via amniocentesis is available should she be interested in confirming this result. I also informed Ms. Kaitlin Koch that results from her SMA carrier screening are still pending. I will call her once these results become available. She confirmed that she had no questions at this time.  Gershon Crane, MS, [facility] Genetic Counselor

## 2020-05-09 ENCOUNTER — Other Ambulatory Visit: Payer: Self-pay

## 2020-05-11 ENCOUNTER — Ambulatory Visit: Payer: Self-pay | Admitting: Obstetrics

## 2020-05-11 ENCOUNTER — Telehealth: Payer: Self-pay | Admitting: Genetic Counselor

## 2020-05-11 NOTE — Telephone Encounter (Addendum)
I called Ms. Kaitlin Koch with the help of [place] [employer]pre[employer] Angelica, ID# [phone] to discuss her negative carrier screening for spinal muscular atrophy (SMA). 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 *3+80T>G SNP associated with silent carrier status. Based on the sensitivity of the screen, there is a 1 in 784 residual risk for Ms. Kaitlin Koch to be a carrier for SMA. Thus, this result significantly reduces, but does not eliminate her risk to be a carrier for SMA and to have a child affected by SMA.   Ms. Kaitlin Koch asked if she would be having an ultrasound with [place] in Maternal Fetal Medicine. I asked her if she had one at the health department and she thought she had and that things with normal but was not 100% sure. I told her I would contact the health department to see if she needed to have an ultrasound with [place]. Linus Orn at the health department confirmed that Ms. Kaitlin Koch had an ultrasound with them on 8/9 that was normal. Thus, an ultrasound appointment is not needed here. Mr. Kaitlin Koch confirmed that she had no further questions at this time.  Gershon Crane, MS, [facility] Genetic Counselor

## 2020-06-27 LAB — GLUCOSE, 1 HOUR GESTATIONAL: Glucose, 1 Hour GTT: 110

## 2020-06-27 LAB — OB RESULTS CONSOLE HIV ANTIBODY (ROUTINE TESTING): HIV: NONREACTIVE

## 2020-08-02 LAB — OB RESULTS CONSOLE GC/CHLAMYDIA
Chlamydia: NEGATIVE
Gonorrhea: NEGATIVE

## 2020-08-07 LAB — OB RESULTS CONSOLE PLATELET COUNT: Platelets: 264

## 2020-08-07 LAB — OB RESULTS CONSOLE HGB/HCT, BLOOD
HCT: 37 (ref 29–41)
Hemoglobin: 12.2

## 2020-08-18 ENCOUNTER — Encounter: Payer: Self-pay | Admitting: *Deleted

## 2020-08-21 ENCOUNTER — Encounter: Payer: Self-pay | Admitting: Obstetrics & Gynecology

## 2020-08-21 ENCOUNTER — Other Ambulatory Visit: Payer: Self-pay

## 2020-08-21 ENCOUNTER — Ambulatory Visit (INDEPENDENT_AMBULATORY_CARE_PROVIDER_SITE_OTHER): Payer: Self-pay

## 2020-08-21 ENCOUNTER — Encounter (HOSPITAL_COMMUNITY): Payer: Self-pay | Admitting: Family Medicine

## 2020-08-21 ENCOUNTER — Inpatient Hospital Stay (HOSPITAL_COMMUNITY)
Admission: AD | Admit: 2020-08-21 | Discharge: 2020-08-25 | DRG: 786 | Disposition: A | Discharge status: Home / Self Care | Payer: Medicaid Other | Attending: Obstetrics and Gynecology | Admitting: Obstetrics and Gynecology

## 2020-08-21 ENCOUNTER — Ambulatory Visit (INDEPENDENT_AMBULATORY_CARE_PROVIDER_SITE_OTHER): Payer: Self-pay | Admitting: Obstetrics & Gynecology

## 2020-08-21 ENCOUNTER — Other Ambulatory Visit (HOSPITAL_COMMUNITY)
Admission: RE | Admit: 2020-08-21 | Discharge: 2020-08-21 | Disposition: A | Discharge status: Home / Self Care | Payer: Self-pay | Source: Ambulatory Visit | Attending: Obstetrics & Gynecology | Admitting: Obstetrics & Gynecology

## 2020-08-21 VITALS — BP 175/103 | HR 67 | Wt 200.5 lb

## 2020-08-21 DIAGNOSIS — O0990 Supervision of high risk pregnancy, unspecified, unspecified trimester: Secondary | ICD-10-CM | POA: Diagnosis present

## 2020-08-21 DIAGNOSIS — O1410 Severe pre-eclampsia, unspecified trimester: Secondary | ICD-10-CM | POA: Diagnosis present

## 2020-08-21 DIAGNOSIS — O1414 Severe pre-eclampsia complicating childbirth: Principal | ICD-10-CM | POA: Diagnosis present

## 2020-08-21 DIAGNOSIS — O1210 Gestational proteinuria, unspecified trimester: Secondary | ICD-10-CM

## 2020-08-21 DIAGNOSIS — O411230 Chorioamnionitis, third trimester, not applicable or unspecified: Secondary | ICD-10-CM | POA: Diagnosis present

## 2020-08-21 DIAGNOSIS — O1490 Unspecified pre-eclampsia, unspecified trimester: Secondary | ICD-10-CM

## 2020-08-21 DIAGNOSIS — O4593 Premature separation of placenta, unspecified, third trimester: Secondary | ICD-10-CM | POA: Diagnosis present

## 2020-08-21 DIAGNOSIS — Z20822 Contact with and (suspected) exposure to covid-19: Secondary | ICD-10-CM | POA: Diagnosis present

## 2020-08-21 DIAGNOSIS — Z98891 History of uterine scar from previous surgery: Secondary | ICD-10-CM | POA: Insufficient documentation

## 2020-08-21 DIAGNOSIS — O1413 Severe pre-eclampsia, third trimester: Secondary | ICD-10-CM

## 2020-08-21 DIAGNOSIS — O4100X1 Oligohydramnios, unspecified trimester, fetus 1: Secondary | ICD-10-CM

## 2020-08-21 DIAGNOSIS — Z3A36 36 weeks gestation of pregnancy: Secondary | ICD-10-CM

## 2020-08-21 DIAGNOSIS — O0993 Supervision of high risk pregnancy, unspecified, third trimester: Secondary | ICD-10-CM

## 2020-08-21 DIAGNOSIS — O99334 Smoking (tobacco) complicating childbirth: Secondary | ICD-10-CM | POA: Diagnosis present

## 2020-08-21 DIAGNOSIS — O411290 Chorioamnionitis, unspecified trimester, not applicable or unspecified: Secondary | ICD-10-CM | POA: Diagnosis not present

## 2020-08-21 DIAGNOSIS — O4100X0 Oligohydramnios, unspecified trimester, not applicable or unspecified: Secondary | ICD-10-CM

## 2020-08-21 LAB — COMPREHENSIVE METABOLIC PANEL
ALT: 32 U/L (ref 0–44)
AST: 44 U/L — ABNORMAL HIGH (ref 15–41)
Albumin: 2.1 g/dL — ABNORMAL LOW (ref 3.5–5.0)
Alkaline Phosphatase: 234 U/L — ABNORMAL HIGH (ref 38–126)
Anion gap: 10 (ref 5–15)
BUN: 14 mg/dL (ref 6–20)
CO2: 19 mmol/L — ABNORMAL LOW (ref 22–32)
Calcium: 8.5 mg/dL — ABNORMAL LOW (ref 8.9–10.3)
Chloride: 108 mmol/L (ref 98–111)
Creatinine, Ser: 0.87 mg/dL (ref 0.44–1.00)
GFR, Estimated: >60 mL/min (ref >60)
Glucose, Bld: 69 mg/dL — ABNORMAL LOW (ref 70–99)
Potassium: 4.1 mmol/L (ref 3.5–5.1)
Sodium: 137 mmol/L (ref 135–145)
Total Bilirubin: 0.5 mg/dL (ref 0.3–1.2)
Total Protein: 5.5 g/dL — ABNORMAL LOW (ref 6.5–8.1)

## 2020-08-21 LAB — CBC
HCT: 37.3 % (ref 36.0–46.0)
Hemoglobin: 12.4 g/dL (ref 12.0–15.0)
MCH: 29.5 pg (ref 26.0–34.0)
MCHC: 33.2 g/dL (ref 30.0–36.0)
MCV: 88.8 fL (ref 80.0–100.0)
Platelets: 200 10*3/uL (ref 150–400)
RBC: 4.2 MIL/uL (ref 3.87–5.11)
RDW: 16 % — ABNORMAL HIGH (ref 11.5–15.5)
WBC: 9.1 10*3/uL (ref 4.0–10.5)
nRBC: 0 % (ref 0.0–0.2)

## 2020-08-21 LAB — POCT URINALYSIS DIP (DEVICE)
Glucose, UA: NEGATIVE mg/dL
Ketones, ur: NEGATIVE mg/dL
Leukocytes,Ua: NEGATIVE
Nitrite: NEGATIVE
Protein, ur: >=300 mg/dL — AB
Specific Gravity, Urine: >=1.03 (ref 1.005–1.030)
Urobilinogen, UA: 0.2 mg/dL (ref 0.0–1.0)
pH: 6 (ref 5.0–8.0)

## 2020-08-21 LAB — PROTEIN / CREATININE RATIO, URINE
Creatinine, Urine: 88.61 mg/dL
Protein Creatinine Ratio: 12.17 mg/mg{Cre} — ABNORMAL HIGH (ref 0.00–0.15)
Total Protein, Urine: 1078 mg/dL

## 2020-08-21 LAB — TYPE AND SCREEN
ABO/RH(D): O POS
Antibody Screen: NEGATIVE

## 2020-08-21 MED ORDER — LACTATED RINGERS IV SOLN: INTRAVENOUS | Status: DC

## 2020-08-21 MED ORDER — HYDRALAZINE HCL 20 MG/ML IJ SOLN
10.0000 mg | INTRAMUSCULAR | Status: DC | PRN
  Administered 2020-08-21: 10 mg via INTRAVENOUS
  Filled 2020-08-21: qty 1

## 2020-08-21 MED ORDER — MAGNESIUM SULFATE BOLUS VIA INFUSION
4.0000 g | Freq: Once | INTRAVENOUS | Status: AC
  Administered 2020-08-21: 4 g via INTRAVENOUS
  Filled 2020-08-21: qty 1000

## 2020-08-21 MED ORDER — OXYCODONE-ACETAMINOPHEN 5-325 MG PO TABS: 1.0000 | ORAL_TABLET | ORAL | Status: DC | PRN

## 2020-08-21 MED ORDER — TERBUTALINE SULFATE 1 MG/ML IJ SOLN: 0.2500 mg | Freq: Once | INTRAMUSCULAR | Status: DC | PRN

## 2020-08-21 MED ORDER — ONDANSETRON HCL 4 MG/2ML IJ SOLN
4.0000 mg | Freq: Four times a day (QID) | INTRAMUSCULAR | Status: DC | PRN
  Administered 2020-08-22: 4 mg via INTRAVENOUS
  Filled 2020-08-21: qty 2

## 2020-08-21 MED ORDER — OXYTOCIN-SODIUM CHLORIDE 30-0.9 UT/500ML-% IV SOLN
1.0000 m[IU]/min | INTRAVENOUS | Status: DC
  Administered 2020-08-21: 2 m[IU]/min via INTRAVENOUS
  Filled 2020-08-21: qty 500

## 2020-08-21 MED ORDER — LACTATED RINGERS IV SOLN
500.0000 mL | INTRAVENOUS | Status: DC | PRN
  Administered 2020-08-22: 300 mL via INTRAVENOUS

## 2020-08-21 MED ORDER — PENICILLIN G POT IN DEXTROSE 60000 UNIT/ML IV SOLN
3.0000 10*6.[IU] | INTRAVENOUS | Status: DC
  Administered 2020-08-22 (×3): 3 10*6.[IU] via INTRAVENOUS
  Filled 2020-08-21 (×3): qty 50

## 2020-08-21 MED ORDER — ACETAMINOPHEN 325 MG PO TABS
650.0000 mg | ORAL_TABLET | ORAL | Status: DC | PRN
  Administered 2020-08-22: 650 mg via ORAL
  Filled 2020-08-21 (×2): qty 2

## 2020-08-21 MED ORDER — MAGNESIUM SULFATE 40 GM/1000ML IV SOLN
2.0000 g/h | INTRAVENOUS | Status: DC
  Administered 2020-08-21: 2 g/h via INTRAVENOUS
  Filled 2020-08-21 (×2): qty 1000

## 2020-08-21 MED ORDER — LABETALOL HCL 5 MG/ML IV SOLN
40.0000 mg | INTRAVENOUS | Status: DC | PRN
  Administered 2020-08-21 – 2020-08-22 (×2): 40 mg via INTRAVENOUS
  Filled 2020-08-21 (×2): qty 8

## 2020-08-21 MED ORDER — OXYCODONE-ACETAMINOPHEN 5-325 MG PO TABS: 2.0000 | ORAL_TABLET | ORAL | Status: DC | PRN

## 2020-08-21 MED ORDER — LIDOCAINE HCL (PF) 1 % IJ SOLN: 30.0000 mL | INTRAMUSCULAR | Status: DC | PRN

## 2020-08-21 MED ORDER — LABETALOL HCL 5 MG/ML IV SOLN
80.0000 mg | INTRAVENOUS | Status: DC | PRN
  Administered 2020-08-21: 80 mg via INTRAVENOUS
  Filled 2020-08-21: qty 16

## 2020-08-21 MED ORDER — FENTANYL CITRATE (PF) 100 MCG/2ML IJ SOLN: 50.0000 ug | INTRAMUSCULAR | Status: DC | PRN

## 2020-08-21 MED ORDER — LABETALOL HCL 5 MG/ML IV SOLN
20.0000 mg | INTRAVENOUS | Status: DC | PRN
  Administered 2020-08-21 – 2020-08-22 (×3): 20 mg via INTRAVENOUS
  Filled 2020-08-21 (×4): qty 4

## 2020-08-21 MED ORDER — OXYTOCIN BOLUS FROM INFUSION: 333.0000 mL | Freq: Once | INTRAVENOUS | Status: DC

## 2020-08-21 MED ORDER — SOD CITRATE-CITRIC ACID 500-334 MG/5ML PO SOLN
30.0000 mL | ORAL | Status: DC | PRN
  Administered 2020-08-22: 30 mL via ORAL
  Filled 2020-08-21: qty 15

## 2020-08-21 MED ORDER — OXYTOCIN-SODIUM CHLORIDE 30-0.9 UT/500ML-% IV SOLN: 2.5000 [IU]/h | INTRAVENOUS | Status: DC

## 2020-08-21 MED ORDER — SODIUM CHLORIDE 0.9 % IV SOLN
5.0000 10*6.[IU] | Freq: Once | INTRAVENOUS | Status: AC
  Administered 2020-08-21: 5 10*6.[IU] via INTRAVENOUS
  Filled 2020-08-21: qty 5

## 2020-08-21 NOTE — Progress Notes (Signed)
Interpreter [employer] present for encounter. Transfer of care from [facility]. Pt reports having occasional H/A - relieved with Tylenol. She had H/A earlier today however denies visual disturbances. Food insecurity identified today. One Grab and go bag of food given to pt. She will be called by a Solicitor to learn additional information for future food needs.   Pt informed that the ultrasound is considered a limited OB ultrasound and is not intended to be a complete ultrasound exam.  Patient also informed that the ultrasound is not being completed with the intent of assessing for fetal or placental anomalies or any pelvic abnormalities.  Explained that the purpose of today's ultrasound is to assess for presentation, BPP and amniotic fluid volume.  Patient acknowledges the purpose of the exam and the limitations of the study.

## 2020-08-21 NOTE — Progress Notes (Signed)
   PRENATAL VISIT NOTE  Subjective:  Kaitlin Koch is a 34 y.o. [phone] at [redacted]w[redacted]d being seen today for new ob visit.  She is a transfer from [facility] for mild pre eclampsia diagnosed at the last visit with urine protein/creatinine ratio 0.640mg /mg.  Her blood pressures have been mildly elevated the last two visits.  With increased protein/creatinine ratio, transfer here was recommended.  She reports she just hasn't felt well the last three days.  Has h/o chronic headahces but has been experiencing more headaches.  Took Tylenol today.  Denies visual changes.  Fetal movement is decreased the past three days as well.  Still feeling baby move but not as much.  She is currently monitored for the following issues for this high-risk pregnancy and has Supervision of high risk pregnancy, antepartum and Pre-eclampsia, antepartum on their problem list.   BPP today showed reactive NST.  Single largest pocket of fluid was 2.2cm.  Total AFI 5.7cm.  Patient reports headaches as per above.  Contractions: Irregular. Vag. Bleeding: None.  Movement: Present. Denies leaking of fluid.   The following portions of the patient's history were reviewed and updated as appropriate: allergies, current medications, past family history, past medical history, past social history, past surgical history and problem list.   Objective:   Vitals:   08/21/20 1512 08/21/20 1601  BP: (!) 159/107 (!) 175/103  Pulse: 67   Weight: 200 lb 8 oz (90.9 kg)     Fetal Status:     Movement: Present  Presentation: Vertex  General:  Alert, oriented and cooperative. Patient is in no acute distress.  Skin: Skin is warm and dry. No rash noted.   Cardiovascular: Normal heart rate noted  Respiratory: Normal respiratory effort, no problems with respiration noted  Abdomen: Soft, gravid, appropriate for gestational age.  Pain/Pressure: Present     Pelvic: Cervical exam deferred        Extremities: Normal range of motion.       Mental Status: Normal mood and affect. Normal behavior. Normal judgment and thought content.   Assessment and Plan:  Pregnancy: [id number] at [redacted]w[redacted]d 1. Supervision of high risk pregnancy, antepartum - aspirin EC 81 MG tablet; Take 81 mg by mouth daily. Swallow whole. - Strep Gp B NAA - GC/Chlamydia probe amp ([place])not at [facility]  2. [redacted] weeks gestation of pregnancy -on PNV  3. Severe pre-eclampsia in third trimester.  BPs today 159/107 and 175/103.  4+ proteinuria today -Spoke with Dr. Jolayne Panther about direct admission for induction with severe preeclampsia by pressures. -Pt lives 10 minutes away and has minor with her.  She is going to take son home and go directly to [facility].  Strict precautions given.  Feel she is safe for going ot the hos  4. Oligohydramnios   Please refer to After Visit Summary for other counseling recommendations.   Jerene Bears, MD

## 2020-08-21 NOTE — Progress Notes (Signed)
Labor Progress Note Kaitlin Koch is a 34 y.o. [phone] at [redacted]w[redacted]d presented for IOL secondary to preeclampsia with severe features (based on HA and Bps).  S: Pt endorses mild HA, improved from prior. No concerning vision changes or other concerns.  O:  BP 133/89   Pulse 79   Temp 98.2 F (36.8 C) (Oral)   Ht 5\' 2"  (1.575 m)   Wt 90.9 kg   SpO2 98%   BMI 36.65 kg/m  EFM: baseline 120/minimal to moderate variability/+accels/single variable decel  CVE: Dilation: 1 Effacement (%): 50 Station: -2 Presentation: Vertex (confirmed by u/s) Exam by:: Dr.Pray   A&P: 34 y.o. 20 [redacted]w[redacted]d presented for IOL secondary to preeclampsia with severe features (based on HA and Bps). #IOL: S/p FB placement at 2115. Low dose pitocin also started at time of FB placement. Will plan to up-titrate pitocin when FB is out. #Pain: pt does not plan for epidural #FWB: Category 2 strip given minimal to moderate variability and single variable decel. Reassuringly, +accels. #GBS unknown. (PCR swab negative on admission but given pt is preterm GBS culture was also collected and PCN was initiated at 2038.) #Preeclampsia with SF: Magnesium started on admission at 1938. Pt reports improvement in HA. Pt has required IV anti-hypertensives at 1931 and also at 2100. Most recently blood pressures in mild to normal range. Will continue to monitor closely.  2101, MD OB Fellow, Faculty Practice 08/22/2020 12:11 AM

## 2020-08-21 NOTE — H&P (Addendum)
OBSTETRIC ADMISSION HISTORY AND PHYSICAL  Kaitlin Koch is a 34 y.o. female [phone] with IUP at [redacted]w[redacted]d by 19wk sono presenting as a direct admission for pre-eclampsia with severe features (BP and HA). She presented as a transfer from the health department to [facility] and found to have severe range blood pressures and headache in clinic. She reports +FMs, No LOF, no VB, and only intermittent contractions. She reports no blurry vision, or peripheral edema, and RUQ pain. She notes she had a headache this morning but none currently. She plans on breast feeding and bottle feeding.. She request nexplanon outpatient for birth control. She received her prenatal care at  [facility]   Dating: By 19 wk sono discrepant with LMP--->  Estimated Date of Delivery: 09/15/20  Sono:   08/07/20 @[redacted]w[redacted]d , CWD, normal anatomy, cephalic presentation, 2303g, 01-24-2006 EFW  Prenatal History/Complications:  Pre-eclampsia with severe features Tobacco Use during pregnancy Elevated blood pressure in prior pregnancy- she denies issues with blood pressure since that delivery, denies being on Mg or receiving anti-hypertensives  Past Medical History: No past medical history on file.  Past Surgical History: Past Surgical History:  Procedure Laterality Date  . NO PAST SURGERIES      Obstetrical History: OB History    Gravida  5   Para  3   Term  3   Preterm      AB  1   Living  3     SAB  1   TAB      Ectopic      Multiple      Live Births  3           Social History Social History   Socioeconomic History  . Marital status: Single    Spouse name: Not on file  . Number of children: Not on file  . Years of education: Not on file  . Highest education level: Not on file  Occupational History  . Not on file  Tobacco Use  . Smoking status: Current Every Day Smoker  . Smokeless tobacco: Never Used  Substance and Sexual Activity  . Alcohol use: No  . Drug use: No  . Sexual activity: Yes  Other  Topics Concern  . Not on file  Social History Narrative  . Not on file   Social Determinants of Health   Financial Resource Strain:   . Difficulty of Paying Living Expenses: Not on file  Food Insecurity: Food Insecurity Present  . Worried About 99.2% in the Last Year: Sometimes true  . Ran Out of Food in the Last Year: Never true  Transportation Needs: No Transportation Needs  . Lack of Transportation (Medical): No  . Lack of Transportation (Non-Medical): No  Physical Activity:   . Days of Exercise per Week: Not on file  . Minutes of Exercise per Session: Not on file  Stress:   . Feeling of Stress : Not on file  Social Connections:   . Frequency of Communication with Friends and Family: Not on file  . Frequency of Social Gatherings with Friends and Family: Not on file  . Attends Religious Services: Not on file  . Active Member of Clubs or Organizations: Not on file  . Attends Programme researcher, broadcasting/film/video Meetings: Not on file  . Marital Status: Not on file   Family History: Family History  Problem Relation Age of Onset  . Depression Neg Hx   . Other Neg Hx    Allergies: Allergies  Allergen Reactions  .  Pork-Derived Products Rash    Medications Prior to Admission  Medication Sig Dispense Refill Last Dose  . acetaminophen (TYLENOL) 325 MG tablet Take 325 mg by mouth every 6 (six) hours as needed. pain   08/21/2020 at 0800  . aspirin EC 81 MG tablet Take 81 mg by mouth daily. Swallow whole.   08/21/2020 at 0800  . Prenatal Vit-Fe Fumarate-FA (PRENATAL MULTIVITAMIN) TABS Take 1 tablet by mouth daily.   08/20/2020 at Unknown time   Review of Systems   All systems reviewed and negative except as stated in HPI  Blood pressure (!) 153/104, pulse 65, temperature 98.2 F (36.8 C), temperature source Oral, height 5\' 2"  (1.575 m), weight 90.9 kg, SpO2 98 %. General appearance: alert and no distress Lungs: no dyspnea Heart: regular rate Abdomen: soft, non-tender;  gravid Pelvic: as stated below Extremities: Homans sign is negative, no sign of DVT, patellar reflexes 1+ bilaterally, no clonus appreciated bilaterally Presentation: cephalic confirmed by bedside ultrasound  Fetal monitoringBaseline: 135 bpm, Variability: Good {> 6 bpm), Accelerations: Reactive and Decelerations: Absent Uterine activity: None   Dilation: 1 Effacement (%): 50 Station: -2 Exam by:: Dr.Rosann Gorum Prenatal labs: ABO, Rh: --/--/PENDING (12/06 1930) O positive Antibody: PENDING (12/06 1930) Negative Rubella: Immune (08/09 0000) RPR: Nonreactive, Nonreactive (08/09 0000)  HBsAg: Negative (08/09 0000)  HIV: Non-reactive (10/12 0000)  GBS:   unknown 1 hr Glucola normal Genetic screening  normal Anatomy 11-04-1974 normal  Prenatal Transfer Tool  Maternal Diabetes: No Genetic Screening: Normal Maternal Ultrasounds/Referrals: Normal Fetal Ultrasounds or other Referrals:  None Maternal Substance Abuse:  No Significant Maternal Medications:  None Significant Maternal Lab Results: Other: GBS unknwon  Results for orders placed or performed during the hospital encounter of 08/21/20 (from the past 24 hour(s))  Type and screen [facility]   Collection Time: 08/21/20  7:30 PM  Result Value Ref Range   ABO/RH(D) PENDING    Antibody Screen PENDING    Sample Expiration      08/24/2020,2359 Performed at [facility], [address], [place], [place]   Results for orders placed or performed in visit on 08/21/20 (from the past 24 hour(s))  POCT urinalysis dip (device)   Collection Time: 08/21/20  4:27 PM  Result Value Ref Range   Glucose, UA NEGATIVE NEGATIVE mg/dL   Bilirubin Urine SMALL (A) NEGATIVE   Ketones, ur NEGATIVE NEGATIVE mg/dL   Specific Gravity, Urine >=1.030 1.005 - 1.030   Hgb urine dipstick TRACE (A) NEGATIVE   pH 6.0 5.0 - 8.0   Protein, ur >=300 (A) NEGATIVE mg/dL   Urobilinogen, UA 0.2 0.0 - 1.0 mg/dL   Nitrite NEGATIVE NEGATIVE    Leukocytes,Ua NEGATIVE NEGATIVE    Patient Active Problem List   Diagnosis Date Noted  . Supervision of high risk pregnancy, antepartum 08/21/2020  . Severe pre-eclampsia in third trimester 08/21/2020  . Severe pre-eclampsia 08/21/2020    Assessment/Plan:  Kaitlin Koch is a 34 y.o. [id number] at [redacted]w[redacted]d here for IOL 2/2 to Pre-E with SF (BP and HA)  #IOL: Based on SVE, likely start with foley bulb and pitocin.  #PreE with SF: based on severe range blood pressures and headache. BP 184/104 on presented to L&D, s/p Labetalol x1 @1931  and Magnesium Sulfate 4g bolus started at @1939 . BP responded  appropriately and is mild range 150s/100s now on Mg. Pre-e labs pending.   #ID:GBS unknown: started PCN G, and group B strep culture ordered as pt is < 37  WGA.  #Pain: Prn IV pain meds, she is not planning for epidural at this time #FWB: Cat 1  #MOF: breast/bottle #MOC:nexplanon outpt #Circ: does not desire  Doylene Canard, Medical Student  08/21/2020, 8:20 PM   I  have seen and examined this patient, reviewed their chart. I have discussed this patient with the medical student, personally interviewed the patient, and I agree with the above findings, assessment and care plan.  Billey Co, MD Center for [facility], [facility] 8:40 PM

## 2020-08-21 NOTE — Progress Notes (Signed)
Of note, per review of HD records patient has history of 2 SAB.  Thus, she is [id number], this has been updated in her chart.  Also history of food insecurity per chart review, and with late presentation to prenatal care. Plan to discuss with and consult SW postpartum.  Burley Saver MD Center for [employer], [facility]

## 2020-08-22 ENCOUNTER — Encounter (HOSPITAL_COMMUNITY)
Admission: AD | Disposition: A | Discharge status: Home / Self Care | Payer: Self-pay | Source: Home / Self Care | Attending: Obstetrics and Gynecology

## 2020-08-22 ENCOUNTER — Inpatient Hospital Stay (HOSPITAL_COMMUNITY): Payer: Medicaid Other | Admitting: Anesthesiology

## 2020-08-22 ENCOUNTER — Encounter (HOSPITAL_COMMUNITY): Payer: Self-pay | Admitting: Obstetrics and Gynecology

## 2020-08-22 DIAGNOSIS — Z3A36 36 weeks gestation of pregnancy: Secondary | ICD-10-CM

## 2020-08-22 DIAGNOSIS — O324XX0 Maternal care for high head at term, not applicable or unspecified: Secondary | ICD-10-CM

## 2020-08-22 DIAGNOSIS — O4593 Premature separation of placenta, unspecified, third trimester: Secondary | ICD-10-CM

## 2020-08-22 DIAGNOSIS — O1414 Severe pre-eclampsia complicating childbirth: Secondary | ICD-10-CM

## 2020-08-22 LAB — COMPREHENSIVE METABOLIC PANEL
ALT: 34 U/L (ref 0–44)
AST: 44 U/L — ABNORMAL HIGH (ref 15–41)
Albumin: 2 g/dL — ABNORMAL LOW (ref 3.5–5.0)
Alkaline Phosphatase: 234 U/L — ABNORMAL HIGH (ref 38–126)
Anion gap: 8 (ref 5–15)
BUN: 10 mg/dL (ref 6–20)
CO2: 19 mmol/L — ABNORMAL LOW (ref 22–32)
Calcium: 7 mg/dL — ABNORMAL LOW (ref 8.9–10.3)
Chloride: 104 mmol/L (ref 98–111)
Creatinine, Ser: 0.85 mg/dL (ref 0.44–1.00)
GFR, Estimated: >60 mL/min (ref >60)
Glucose, Bld: 90 mg/dL (ref 70–99)
Potassium: 4.2 mmol/L (ref 3.5–5.1)
Sodium: 131 mmol/L — ABNORMAL LOW (ref 135–145)
Total Bilirubin: 0.6 mg/dL (ref 0.3–1.2)
Total Protein: 5.4 g/dL — ABNORMAL LOW (ref 6.5–8.1)

## 2020-08-22 LAB — CBC
HCT: 37 % (ref 36.0–46.0)
Hemoglobin: 12.5 g/dL (ref 12.0–15.0)
MCH: 29.2 pg (ref 26.0–34.0)
MCHC: 33.8 g/dL (ref 30.0–36.0)
MCV: 86.4 fL (ref 80.0–100.0)
Platelets: 173 10*3/uL (ref 150–400)
RBC: 4.28 MIL/uL (ref 3.87–5.11)
RDW: 16.4 % — ABNORMAL HIGH (ref 11.5–15.5)
WBC: 10.6 10*3/uL — ABNORMAL HIGH (ref 4.0–10.5)
nRBC: 0 % (ref 0.0–0.2)

## 2020-08-22 LAB — GC/CHLAMYDIA PROBE AMP (~~LOC~~) NOT AT ARMC
Chlamydia: NEGATIVE
Comment: NEGATIVE
Comment: NORMAL
Neisseria Gonorrhea: NEGATIVE

## 2020-08-22 LAB — RPR: RPR Ser Ql: NONREACTIVE

## 2020-08-22 LAB — MAGNESIUM: Magnesium: 6.6 mg/dL (ref 1.7–2.4)

## 2020-08-22 LAB — RESP PANEL BY RT-PCR (FLU A&B, COVID) ARPGX2
Influenza A by PCR: NEGATIVE
Influenza B by PCR: NEGATIVE
SARS Coronavirus 2 by RT PCR: NEGATIVE

## 2020-08-22 SURGERY — Surgical Case
Anesthesia: Spinal

## 2020-08-22 MED ORDER — MAGNESIUM HYDROXIDE 400 MG/5ML PO SUSP
30.0000 mL | ORAL | Status: DC | PRN
  Administered 2020-08-23: 30 mL via ORAL
  Filled 2020-08-22: qty 30

## 2020-08-22 MED ORDER — OXYCODONE HCL 5 MG PO TABS: 5.0000 mg | ORAL_TABLET | Freq: Once | ORAL | Status: DC | PRN

## 2020-08-22 MED ORDER — MORPHINE SULFATE (PF) 0.5 MG/ML IJ SOLN
INTRAMUSCULAR | Status: DC | PRN
  Administered 2020-08-22: 150 ug via INTRATHECAL

## 2020-08-22 MED ORDER — LACTATED RINGERS AMNIOINFUSION: INTRAVENOUS | Status: DC

## 2020-08-22 MED ORDER — PHENYLEPHRINE HCL-NACL 20-0.9 MG/250ML-% IV SOLN
INTRAVENOUS | Status: AC
  Filled 2020-08-22: qty 250

## 2020-08-22 MED ORDER — TRANEXAMIC ACID-NACL 1000-0.7 MG/100ML-% IV SOLN
1000.0000 mg | INTRAVENOUS | Status: AC
  Administered 2020-08-22: 1000 mg via INTRAVENOUS

## 2020-08-22 MED ORDER — SODIUM CHLORIDE 0.9 % IV SOLN: INTRAVENOUS | Status: DC | PRN

## 2020-08-22 MED ORDER — SCOPOLAMINE 1 MG/3DAYS TD PT72
MEDICATED_PATCH | TRANSDERMAL | Status: AC
  Filled 2020-08-22: qty 1

## 2020-08-22 MED ORDER — ONDANSETRON HCL 4 MG/2ML IJ SOLN
4.0000 mg | Freq: Three times a day (TID) | INTRAMUSCULAR | Status: DC | PRN
  Administered 2020-08-22: 4 mg via INTRAVENOUS
  Filled 2020-08-22: qty 2

## 2020-08-22 MED ORDER — OXYCODONE HCL 5 MG/5ML PO SOLN: 5.0000 mg | Freq: Once | ORAL | Status: DC | PRN

## 2020-08-22 MED ORDER — MORPHINE SULFATE (PF) 0.5 MG/ML IJ SOLN
INTRAMUSCULAR | Status: AC
  Filled 2020-08-22: qty 10

## 2020-08-22 MED ORDER — SODIUM CHLORIDE 0.9 % IV SOLN
2.0000 g | INTRAVENOUS | Status: AC
  Administered 2020-08-22: 2 g via INTRAVENOUS
  Filled 2020-08-22: qty 2

## 2020-08-22 MED ORDER — MAGNESIUM SULFATE 40 GM/1000ML IV SOLN
1.0000 g/h | INTRAVENOUS | Status: DC
  Administered 2020-08-23: 2 g/h via INTRAVENOUS
  Filled 2020-08-22: qty 1000

## 2020-08-22 MED ORDER — KETOROLAC TROMETHAMINE 30 MG/ML IJ SOLN
30.0000 mg | Freq: Once | INTRAMUSCULAR | Status: AC | PRN
  Administered 2020-08-22: 30 mg via INTRAVENOUS

## 2020-08-22 MED ORDER — PRENATAL MULTIVITAMIN CH
1.0000 | ORAL_TABLET | Freq: Every day | ORAL | Status: DC
  Administered 2020-08-23 – 2020-08-24 (×2): 1 via ORAL
  Filled 2020-08-22 (×2): qty 1

## 2020-08-22 MED ORDER — SCOPOLAMINE 1 MG/3DAYS TD PT72
1.0000 | MEDICATED_PATCH | Freq: Once | TRANSDERMAL | Status: AC
  Administered 2020-08-22: 1.5 mg via TRANSDERMAL

## 2020-08-22 MED ORDER — ONDANSETRON HCL 4 MG/2ML IJ SOLN: 4.0000 mg | Freq: Once | INTRAMUSCULAR | Status: DC | PRN

## 2020-08-22 MED ORDER — HYDROMORPHONE HCL 1 MG/ML IJ SOLN: 0.2500 mg | INTRAMUSCULAR | Status: DC | PRN

## 2020-08-22 MED ORDER — FERROUS SULFATE 325 (65 FE) MG PO TABS
325.0000 mg | ORAL_TABLET | Freq: Two times a day (BID) | ORAL | Status: DC
  Administered 2020-08-23 – 2020-08-24 (×3): 325 mg via ORAL
  Filled 2020-08-22 (×3): qty 1

## 2020-08-22 MED ORDER — LACTATED RINGERS IV SOLN: INTRAVENOUS | Status: DC

## 2020-08-22 MED ORDER — ZOLPIDEM TARTRATE 5 MG PO TABS: 5.0000 mg | ORAL_TABLET | Freq: Every evening | ORAL | Status: DC | PRN

## 2020-08-22 MED ORDER — SIMETHICONE 80 MG PO CHEW
80.0000 mg | CHEWABLE_TABLET | ORAL | Status: DC
  Administered 2020-08-23 – 2020-08-24 (×2): 80 mg via ORAL
  Filled 2020-08-22 (×3): qty 1

## 2020-08-22 MED ORDER — TETANUS-DIPHTH-ACELL PERTUSSIS 5-2.5-18.5 LF-MCG/0.5 IM SUSY: 0.5000 mL | PREFILLED_SYRINGE | Freq: Once | INTRAMUSCULAR | Status: DC

## 2020-08-22 MED ORDER — WITCH HAZEL-GLYCERIN EX PADS: 1.0000 "application " | MEDICATED_PAD | CUTANEOUS | Status: DC | PRN

## 2020-08-22 MED ORDER — NALBUPHINE HCL 10 MG/ML IJ SOLN
5.0000 mg | INTRAMUSCULAR | Status: DC | PRN
  Filled 2020-08-22: qty 1

## 2020-08-22 MED ORDER — NALBUPHINE HCL 10 MG/ML IJ SOLN: 5.0000 mg | INTRAMUSCULAR | Status: DC | PRN

## 2020-08-22 MED ORDER — ONDANSETRON HCL 4 MG/2ML IJ SOLN
INTRAMUSCULAR | Status: DC | PRN
  Administered 2020-08-22: 4 mg via INTRAVENOUS

## 2020-08-22 MED ORDER — FAMOTIDINE IN NACL 20-0.9 MG/50ML-% IV SOLN
20.0000 mg | Freq: Once | INTRAVENOUS | Status: AC
  Administered 2020-08-22: 20 mg via INTRAVENOUS
  Filled 2020-08-22: qty 50

## 2020-08-22 MED ORDER — OXYTOCIN-SODIUM CHLORIDE 30-0.9 UT/500ML-% IV SOLN
INTRAVENOUS | Status: DC | PRN
  Administered 2020-08-22: 30 [IU] via INTRAVENOUS

## 2020-08-22 MED ORDER — NALOXONE HCL 0.4 MG/ML IJ SOLN: 0.4000 mg | INTRAMUSCULAR | Status: DC | PRN

## 2020-08-22 MED ORDER — HYDROMORPHONE HCL 1 MG/ML IJ SOLN: 1.0000 mg | INTRAMUSCULAR | Status: DC | PRN

## 2020-08-22 MED ORDER — OXYCODONE-ACETAMINOPHEN 5-325 MG PO TABS: 2.0000 | ORAL_TABLET | ORAL | Status: DC | PRN

## 2020-08-22 MED ORDER — FENTANYL CITRATE (PF) 100 MCG/2ML IJ SOLN
INTRAMUSCULAR | Status: AC
  Filled 2020-08-22: qty 2

## 2020-08-22 MED ORDER — SIMETHICONE 80 MG PO CHEW
80.0000 mg | CHEWABLE_TABLET | ORAL | Status: DC | PRN
  Administered 2020-08-23 – 2020-08-24 (×2): 80 mg via ORAL
  Filled 2020-08-22: qty 1

## 2020-08-22 MED ORDER — SENNOSIDES-DOCUSATE SODIUM 8.6-50 MG PO TABS
2.0000 | ORAL_TABLET | ORAL | Status: DC
  Administered 2020-08-23 – 2020-08-24 (×3): 2 via ORAL
  Filled 2020-08-22 (×3): qty 2

## 2020-08-22 MED ORDER — MENTHOL 3 MG MT LOZG: 1.0000 | LOZENGE | OROMUCOSAL | Status: DC | PRN

## 2020-08-22 MED ORDER — MEASLES, MUMPS & RUBELLA VAC IJ SOLR: 0.5000 mL | Freq: Once | INTRAMUSCULAR | Status: DC

## 2020-08-22 MED ORDER — NALBUPHINE HCL 10 MG/ML IJ SOLN: 5.0000 mg | Freq: Once | INTRAMUSCULAR | Status: DC | PRN

## 2020-08-22 MED ORDER — DIBUCAINE (PERIANAL) 1 % EX OINT: 1.0000 "application " | TOPICAL_OINTMENT | CUTANEOUS | Status: DC | PRN

## 2020-08-22 MED ORDER — OXYTOCIN-SODIUM CHLORIDE 30-0.9 UT/500ML-% IV SOLN: 2.5000 [IU]/h | INTRAVENOUS | Status: AC

## 2020-08-22 MED ORDER — KETOROLAC TROMETHAMINE 30 MG/ML IJ SOLN
30.0000 mg | Freq: Four times a day (QID) | INTRAMUSCULAR | Status: AC
  Administered 2020-08-23 (×3): 30 mg via INTRAVENOUS
  Filled 2020-08-22 (×3): qty 1

## 2020-08-22 MED ORDER — COCONUT OIL OIL: 1.0000 "application " | TOPICAL_OIL | Status: DC | PRN

## 2020-08-22 MED ORDER — PHENYLEPHRINE HCL-NACL 20-0.9 MG/250ML-% IV SOLN
INTRAVENOUS | Status: DC | PRN
  Administered 2020-08-22: 60 ug/min via INTRAVENOUS

## 2020-08-22 MED ORDER — DEXAMETHASONE SODIUM PHOSPHATE 10 MG/ML IJ SOLN
INTRAMUSCULAR | Status: DC | PRN
  Administered 2020-08-22: 10 mg via INTRAVENOUS

## 2020-08-22 MED ORDER — OXYCODONE HCL 5 MG PO TABS
5.0000 mg | ORAL_TABLET | ORAL | Status: DC | PRN
  Administered 2020-08-23 (×2): 5 mg via ORAL
  Administered 2020-08-24 (×2): 10 mg via ORAL
  Filled 2020-08-22: qty 1
  Filled 2020-08-22 (×2): qty 2
  Filled 2020-08-22: qty 1

## 2020-08-22 MED ORDER — BUPIVACAINE IN DEXTROSE 0.75-8.25 % IT SOLN
INTRATHECAL | Status: DC | PRN
  Administered 2020-08-22: 12 mg via INTRATHECAL

## 2020-08-22 MED ORDER — DIPHENHYDRAMINE HCL 25 MG PO CAPS
25.0000 mg | ORAL_CAPSULE | ORAL | Status: DC | PRN
  Filled 2020-08-22 (×3): qty 1

## 2020-08-22 MED ORDER — DEXAMETHASONE SODIUM PHOSPHATE 10 MG/ML IJ SOLN
INTRAMUSCULAR | Status: AC
  Filled 2020-08-22: qty 1

## 2020-08-22 MED ORDER — BUTALBITAL-APAP-CAFFEINE 50-325-40 MG PO TABS
1.0000 | ORAL_TABLET | ORAL | Status: DC | PRN
  Administered 2020-08-22: 1 via ORAL
  Filled 2020-08-22: qty 1

## 2020-08-22 MED ORDER — NIFEDIPINE ER OSMOTIC RELEASE 30 MG PO TB24
30.0000 mg | ORAL_TABLET | Freq: Every day | ORAL | Status: DC
  Administered 2020-08-22: 30 mg via ORAL
  Filled 2020-08-22: qty 1

## 2020-08-22 MED ORDER — DIPHENHYDRAMINE HCL 25 MG PO CAPS
25.0000 mg | ORAL_CAPSULE | Freq: Four times a day (QID) | ORAL | Status: DC | PRN
  Administered 2020-08-23 – 2020-08-25 (×4): 25 mg via ORAL
  Filled 2020-08-22: qty 1

## 2020-08-22 MED ORDER — SODIUM CHLORIDE 0.9 % IV SOLN
INTRAVENOUS | Status: AC
  Filled 2020-08-22: qty 500

## 2020-08-22 MED ORDER — GABAPENTIN 300 MG PO CAPS
300.0000 mg | ORAL_CAPSULE | Freq: Two times a day (BID) | ORAL | Status: DC
  Administered 2020-08-22 – 2020-08-25 (×6): 300 mg via ORAL
  Filled 2020-08-22 (×6): qty 1

## 2020-08-22 MED ORDER — IBUPROFEN 800 MG PO TABS
800.0000 mg | ORAL_TABLET | Freq: Four times a day (QID) | ORAL | Status: DC
  Administered 2020-08-23 – 2020-08-25 (×5): 800 mg via ORAL
  Filled 2020-08-22 (×6): qty 1

## 2020-08-22 MED ORDER — KETOROLAC TROMETHAMINE 30 MG/ML IJ SOLN
INTRAMUSCULAR | Status: AC
  Filled 2020-08-22: qty 1

## 2020-08-22 MED ORDER — SODIUM CHLORIDE 0.9 % IV SOLN
500.0000 mg | INTRAVENOUS | Status: AC
  Administered 2020-08-22: 500 mg via INTRAVENOUS

## 2020-08-22 MED ORDER — ENOXAPARIN SODIUM 40 MG/0.4ML ~~LOC~~ SOLN
40.0000 mg | SUBCUTANEOUS | Status: DC
  Administered 2020-08-23 – 2020-08-24 (×2): 40 mg via SUBCUTANEOUS
  Filled 2020-08-22 (×2): qty 0.4

## 2020-08-22 MED ORDER — ONDANSETRON HCL 4 MG/2ML IJ SOLN
INTRAMUSCULAR | Status: AC
  Filled 2020-08-22: qty 2

## 2020-08-22 MED ORDER — DIPHENHYDRAMINE HCL 50 MG/ML IJ SOLN
12.5000 mg | INTRAMUSCULAR | Status: DC | PRN
  Administered 2020-08-22: 12.5 mg via INTRAVENOUS
  Filled 2020-08-22: qty 1

## 2020-08-22 MED ORDER — SODIUM CHLORIDE 0.9% FLUSH: 3.0000 mL | INTRAVENOUS | Status: DC | PRN

## 2020-08-22 MED ORDER — SODIUM CHLORIDE 0.9 % IV SOLN
INTRAVENOUS | Status: AC
  Filled 2020-08-22: qty 2

## 2020-08-22 MED ORDER — FENTANYL CITRATE (PF) 100 MCG/2ML IJ SOLN
INTRAMUSCULAR | Status: DC | PRN
  Administered 2020-08-22: 15 ug via INTRATHECAL

## 2020-08-22 MED ORDER — CYCLOBENZAPRINE HCL 10 MG PO TABS
10.0000 mg | ORAL_TABLET | Freq: Three times a day (TID) | ORAL | Status: DC | PRN
  Administered 2020-08-22: 10 mg via ORAL
  Filled 2020-08-22: qty 2

## 2020-08-22 MED ORDER — TRANEXAMIC ACID-NACL 1000-0.7 MG/100ML-% IV SOLN
INTRAVENOUS | Status: AC
  Filled 2020-08-22: qty 100

## 2020-08-22 MED ORDER — NALOXONE HCL 4 MG/10ML IJ SOLN
1.0000 ug/kg/h | INTRAVENOUS | Status: DC | PRN
  Filled 2020-08-22: qty 5

## 2020-08-22 MED ORDER — TRAMADOL HCL 50 MG PO TABS: 50.0000 mg | ORAL_TABLET | Freq: Four times a day (QID) | ORAL | Status: DC | PRN

## 2020-08-22 SURGICAL SUPPLY — 35 items
BENZOIN TINCTURE PRP APPL 2/3 (GAUZE/BANDAGES/DRESSINGS) ×2 IMPLANT
CHLORAPREP W/TINT 26ML (MISCELLANEOUS) ×2 IMPLANT
CLAMP CORD UMBIL (MISCELLANEOUS) IMPLANT
CLOTH BEACON ORANGE TIMEOUT ST (SAFETY) ×2 IMPLANT
DRSG OPSITE POSTOP 4X10 (GAUZE/BANDAGES/DRESSINGS) ×2 IMPLANT
ELECT REM PT RETURN 9FT ADLT (ELECTROSURGICAL) ×2
ELECTRODE REM PT RTRN 9FT ADLT (ELECTROSURGICAL) ×1 IMPLANT
EXTRACTOR VACUUM M CUP 4 TUBE (SUCTIONS) IMPLANT
GAUZE SPONGE 4X4 12PLY STRL LF (GAUZE/BANDAGES/DRESSINGS) ×4 IMPLANT
GLOVE BIOGEL PI IND STRL 7.0 (GLOVE) ×3 IMPLANT
GLOVE BIOGEL PI INDICATOR 7.0 (GLOVE) ×3
GLOVE ECLIPSE 7.0 STRL STRAW (GLOVE) ×2 IMPLANT
GOWN STRL REUS W/TWL LRG LVL3 (GOWN DISPOSABLE) ×4 IMPLANT
HEMOSTAT SURGICEL 2X14 (HEMOSTASIS) ×2 IMPLANT
KIT ABG SYR 3ML LUER SLIP (SYRINGE) IMPLANT
MAT PREVALON FULL STRYKER (MISCELLANEOUS) ×2 IMPLANT
NEEDLE HYPO 22GX1.5 SAFETY (NEEDLE) ×2 IMPLANT
NEEDLE HYPO 25X5/8 SAFETYGLIDE (NEEDLE) ×2 IMPLANT
NS IRRIG 1000ML POUR BTL (IV SOLUTION) ×2 IMPLANT
PACK C SECTION WH (CUSTOM PROCEDURE TRAY) ×2 IMPLANT
PAD ABD 7.5X8 STRL (GAUZE/BANDAGES/DRESSINGS) ×4 IMPLANT
PAD ABD DERMACEA PRESS 5X9 (GAUZE/BANDAGES/DRESSINGS) ×2 IMPLANT
PAD OB MATERNITY 4.3X12.25 (PERSONAL CARE ITEMS) ×2 IMPLANT
PENCIL SMOKE EVAC W/HOLSTER (ELECTROSURGICAL) ×2 IMPLANT
RTRCTR C-SECT PINK 25CM LRG (MISCELLANEOUS) IMPLANT
STRIP SURGICAL 1/2 X 6 IN (GAUZE/BANDAGES/DRESSINGS) ×2 IMPLANT
SUT PDS AB 0 CTX 36 PDP370T (SUTURE) IMPLANT
SUT PLAIN 2 0 XLH (SUTURE) IMPLANT
SUT VIC AB 0 CTX 36 (SUTURE) ×2
SUT VIC AB 0 CTX36XBRD ANBCTRL (SUTURE) ×2 IMPLANT
SUT VIC AB 4-0 KS 27 (SUTURE) ×2 IMPLANT
SYR CONTROL 10ML LL (SYRINGE) ×2 IMPLANT
TOWEL OR 17X24 6PK STRL BLUE (TOWEL DISPOSABLE) ×2 IMPLANT
TRAY FOLEY W/BAG SLVR 14FR LF (SET/KITS/TRAYS/PACK) ×2 IMPLANT
WATER STERILE IRR 1000ML POUR (IV SOLUTION) ×2 IMPLANT

## 2020-08-22 NOTE — Progress Notes (Signed)
Labor Progress Note Kaitlin Koch is a 34 y.o. [phone] at [redacted]w[redacted]d presented for IOL secondary to preeclampsia with severe features (based on HA and BPs).  S: Patient reports continued HA. No other symptoms.  O:  BP (!) 147/105   Pulse 74   Temp 98.7 F (37.1 C) (Oral)   Resp 16   Ht 5\' 2"  (1.575 m)   Wt 90.9 kg   SpO2 98%   BMI 36.65 kg/m  EFM: baseline 115/ minimal variability/+accels/intermittent variable and late decels Toco: q3-5 (Pitocin turned off now)  CVE: Dilation: 5 Effacement (%): 50 Cervical Position: Posterior Station: -2 Presentation: Vertex Exam by:: [email or web address] MD   Results for orders placed or performed during the hospital encounter of 08/21/20 (from the past 24 hour(s))  Type and screen [facility]     Status: None   Collection Time: 08/21/20  7:30 PM  Result Value Ref Range   ABO/RH(D) O POS    Antibody Screen NEG    Sample Expiration      08/24/2020,2359 Performed at [facility], [address] [place], [place]   CBC     Status: Abnormal   Collection Time: 08/21/20  7:34 PM  Result Value Ref Range   WBC 9.1 4.0 - 10.5 K/uL   RBC 4.20 3.87 - 5.11 MIL/uL   Hemoglobin 12.4 12.0 - 15.0 g/dL   HCT 14/06/21 36 - 46 %   MCV 88.8 80.0 - 100.0 fL   MCH 29.5 26.0 - 34.0 pg   MCHC 33.2 30.0 - 36.0 g/dL   RDW 83.3 (H) 82.5 - 05.3 %   Platelets 200 150 - 400 K/uL   nRBC 0.0 0.0 - 0.2 %  RPR     Status: None   Collection Time: 08/21/20  7:34 PM  Result Value Ref Range   RPR Ser Ql NON REACTIVE NON REACTIVE  Comprehensive metabolic panel     Status: Abnormal   Collection Time: 08/21/20  7:34 PM  Result Value Ref Range   Sodium 137 135 - 145 mmol/L   Potassium 4.1 3.5 - 5.1 mmol/L   Chloride 108 98 - 111 mmol/L   CO2 19 (L) 22 - 32 mmol/L   Glucose, Bld 69 (L) 70 - 99 mg/dL   BUN 14 6 - 20 mg/dL   Creatinine, Ser 14/06/21 0.44 - 1.00 mg/dL   Calcium 8.5 (L) 8.9 - 10.3 mg/dL   Total Protein 5.5 (L) 6.5 - 8.1 g/dL    Albumin 2.1 (L) 3.5 - 5.0 g/dL   AST 44 (H) 15 - 41 U/L   ALT 32 0 - 44 U/L   Alkaline Phosphatase 234 (H) 38 - 126 U/L   Total Bilirubin 0.5 0.3 - 1.2 mg/dL   GFR, Estimated 7.34 >19 mL/min   Anion gap 10 5 - 15  Protein / creatinine ratio, urine     Status: Abnormal   Collection Time: 08/21/20  7:34 PM  Result Value Ref Range   Creatinine, Urine 88.61 mg/dL   Total Protein, Urine 1,078 mg/dL   Protein Creatinine Ratio 12.17 (H) 0.00 - 0.15 mg/mg[Cre]  Culture, beta strep (group b only)     Status: None (Preliminary result)   Collection Time: 08/22/20  2:04 AM   Specimen: Vaginal/Rectal; Genital  Result Value Ref Range   Specimen Description VAGINAL/RECTAL    Special Requests NONE    Culture      CULTURE REINCUBATED FOR BETTER GROWTH Performed at [facility], [address], [place], [place] [address]    Report Status PENDING   Resp Panel by RT-PCR (Flu A&B, Covid) Nasopharyngeal Swab     Status: None   Collection Time: 08/22/20  7:03 AM   Specimen: Nasopharyngeal Swab; Nasopharyngeal(NP) swabs in vial transport medium  Result Value Ref Range   SARS Coronavirus 2 by RT PCR NEGATIVE NEGATIVE   Influenza A by PCR NEGATIVE NEGATIVE   Influenza B by PCR NEGATIVE NEGATIVE  Comprehensive metabolic panel     Status: Abnormal   Collection Time: 08/22/20  2:00 PM  Result Value Ref Range   Sodium 131 (L) 135 - 145 mmol/L   Potassium 4.2 3.5 - 5.1 mmol/L   Chloride 104 98 - 111 mmol/L   CO2 19 (L) 22 - 32 mmol/L   Glucose, Bld 90 70 - 99 mg/dL   BUN 10 6 - 20 mg/dL   Creatinine, Ser 1.22 0.44 - 1.00 mg/dL   Calcium 7.0 (L) 8.9 - 10.3 mg/dL   Total Protein 5.4 (L) 6.5 - 8.1 g/dL   Albumin 2.0 (L) 3.5 - 5.0 g/dL   AST 44 (H) 15 - 41 U/L   ALT 34 0 - 44 U/L   Alkaline Phosphatase 234 (H) 38 - 126 U/L   Total Bilirubin 0.6 0.3 - 1.2 mg/dL   GFR, Estimated >48 >25 mL/min   Anion gap 8 5 - 15  CBC     Status: Abnormal   Collection Time: 08/22/20  2:00 PM  Result Value Ref  Range   WBC 10.6 (H) 4.0 - 10.5 K/uL   RBC 4.28 3.87 - 5.11 MIL/uL   Hemoglobin 12.5 12.0 - 15.0 g/dL   HCT 00.3 36 - 46 %   MCV 86.4 80.0 - 100.0 fL   MCH 29.2 26.0 - 34.0 pg   MCHC 33.8 30.0 - 36.0 g/dL   RDW 70.4 (H) 88.8 - 91.6 %   Platelets 173 150 - 400 K/uL   nRBC 0.0 0.0 - 0.2 %  Magnesium     Status: Abnormal   Collection Time: 08/22/20  2:00 PM  Result Value Ref Range   Magnesium 6.6 (HH) 1.7 - 2.4 mg/dL    A&P: 34 y.o. [id number] [redacted]w[redacted]d presented for IOL secondary to preeclampsia with severe features (based on HA and BP). Has received multiple IV doses of antihypertensives, currently on therapeutic magnesium sulfate for eclampsia prophylaxis (recent level 6.6. mg/dl). Still having a headache.  Continued evidence of fetal intolerance of labor with recurrent variable/late decels, resolved with pitocin being off. But still has minimal variability, persistent Category II FHR tracing.  This is the second time pitocin is being turned off due to non-reassuring FHR tracing.  Also no change in cervical exam for twelve hours meeting criterion of failure of cervical descent. This is thus a failure of induction.  Cesarean delivery recommended.  The risks of surgery were discussed with the patient including but were not limited to: bleeding which may require transfusion or reoperation; infection which may require antibiotics; injury to bowel, bladder, ureters or other surrounding organs; injury to the fetus; need for additional procedures including hysterectomy in the event of a life-threatening hemorrhage; formation of adhesions; placental abnormalities wth subsequent pregnancies; incisional problems; thromboembolic phenomenon and other postoperative/anesthesia complications.  The patient concurred with the proposed plan, giving informed written consent for the procedure.  Preoperative prophylactic antibiotics, TXA and SCDs ordered on call to the OR.  Anesthesia and OR staff aware.  To  OR when  ready.   Jaynie Collins, MD, FACOG Obstetrician & Gynecologist, Kaitlin Koch for [employer], [facility]

## 2020-08-22 NOTE — Lactation Note (Signed)
This note was copied from a baby's chart. Lactation Consultation Note  Patient Name: Kaitlin Koch HYIFO'Y Date: 08/22/2020  Used Wallene Huh, Spanish interpreter.  However mom speaks awesome english.  Mom preferred to have her there in case she did not understand.   This is moms 4th baby.  She has never had a baby in the NICU.  Mom has never used a DEBP.  Mom used a manual pump with her last babies when she went back to work.   Mom is on [facility] in [place]. Mom was on [facility] with her previous babies.  Mom reports she breastfed her first for one year and three months.  Mom reports she breastfed her second baby for one and a half years.  Mom reports she was still breastfeeding her first when she got pregnant with her second.  Mom reports she breastfed her third baby for two years. Mom reports no challenges with breastfeeding except that with her first her milk took a while to come in. Discussed WIC referral with mom. WIC referral filled out and faxed.  Mom on [facility] in [place]. Initiated pumping with mom.  Used 24 mm flanges with mom. Mom reports comfort.  Urged mom to pump 8-12 times day for 15 minutes on inititate setting for now.  Discussed how to wash pump parts.  Gave and showed mom colostrum containers.  Urged massage and hand expression prior to pumping to get more milk.  Mom able to hand express small drops easily.  Urged mom to have dad bring Baby labels from the NICU.  Discussed putting a time and date on the label on her milk.  Praised decision to provide her milk to infant urged her to call lactation as needed. Maternal Data    Feeding Feeding Type: Formula Nipple Type: Nfant Extra Slow Flow (gold)  LATCH Score                   Interventions    Lactation Tools Discussed/Used     Consult Status      [name] 08/22/2020, 7:24 PM

## 2020-08-22 NOTE — Transfer of Care (Signed)
Immediate Anesthesia Transfer of Care Note  Patient: Kaitlin Koch  Procedure(s) Performed: CESAREAN SECTION (N/A )  Patient Location: PACU  Anesthesia Type:Spinal  Level of Consciousness: awake, alert  and oriented  Airway & Oxygen Therapy: Patient Spontanous Breathing  Post-op Assessment: Report given to RN and Post -op Vital signs reviewed and stable  Post vital signs: Reviewed and stable  Last Vitals:  Vitals Value Taken Time  BP 122/91 08/22/20 1709  Temp    Pulse 63 08/22/20 1711  Resp    SpO2 97 % 08/22/20 1711  Vitals shown include unvalidated device data.  Last Pain:  Vitals:   08/22/20 1301  TempSrc:   PainSc: Asleep         Complications: No complications documented.

## 2020-08-22 NOTE — Anesthesia Postprocedure Evaluation (Signed)
Anesthesia Post Note  Patient: Kaitlin Koch  Procedure(s) Performed: CESAREAN SECTION (N/A )     Patient location during evaluation: Mother Baby Anesthesia Type: Spinal Level of consciousness: oriented and awake and alert Pain management: pain level controlled Vital Signs Assessment: post-procedure vital signs reviewed and stable Respiratory status: spontaneous breathing and respiratory function stable Cardiovascular status: blood pressure returned to baseline and stable Postop Assessment: no headache, no backache, no apparent nausea or vomiting and able to ambulate Anesthetic complications: no   No complications documented.  Last Vitals:  Vitals:   08/22/20 1715 08/22/20 1730  BP: 127/89   Pulse: (!) 59 60  Resp: 15 20  Temp:    SpO2: 98% 100%    Last Pain:  Vitals:   08/22/20 1730  TempSrc:   PainSc: 0-No pain   Pain Goal:    LLE Motor Response: No movement due to regional block (08/22/20 1730) LLE Sensation: Numbness (08/22/20 1730) RLE Motor Response: No movement due to regional block (08/22/20 1730) RLE Sensation: Numbness (08/22/20 1730)     Epidural/Spinal Function Cutaneous sensation: No Sensation (08/22/20 1730), Patient able to flex knees: No (08/22/20 1730), Patient able to lift hips off bed: No (08/22/20 1730), Back pain beyond tenderness at insertion site: No (08/22/20 1730), Progressively worsening motor and/or sensory loss: No (08/22/20 1730), Bowel and/or bladder incontinence post epidural: No (08/22/20 1730)  Trevor Iha

## 2020-08-22 NOTE — Anesthesia Procedure Notes (Signed)
Spinal  Patient location during procedure: OB Start time: 08/22/2020 3:35 PM End time: 08/22/2020 3:40 PM Staffing Performed: anesthesiologist  Anesthesiologist: Trevor Iha, MD Preanesthetic Checklist Completed: patient identified, IV checked, risks and benefits discussed, surgical consent, monitors and equipment checked, pre-op evaluation and timeout performed Spinal Block Patient position: sitting Prep: DuraPrep and site prepped and draped Patient monitoring: heart rate, cardiac monitor, continuous pulse ox and blood pressure Approach: midline Location: L3-4 Injection technique: single-shot Needle Needle type: Pencan  Needle gauge: 24 G Needle length: 10 cm Needle insertion depth: 7 cm Assessment Sensory level: T4 Additional Notes 1 Attempt (s). Pt tolerated procedure well.

## 2020-08-22 NOTE — Progress Notes (Addendum)
Postpartum Day 1: Cesarean Delivery at [redacted]w[redacted]d after failed IOL for severe preeclampsia Subjective: Patient is Spanish-speaking only, AMN interpreter used for this encounter. Patient denies any headaches, visual symptoms, RUQ/epigastric pain or other concerning symptoms. Patient reports incisional pain and tolerating PO.  No flatus yet. Breastfeeding, baby doing well in NICU.  Moderate lochia.  Objective: Vital signs in last 24 hours: Temp:  [97.7 F (36.5 C)-98.8 F (37.1 C)] 98.3 F (36.8 C) (12/08 0343) Pulse Rate:  [53-79] 53 (12/08 0343) Resp:  [14-21] 18 (12/08 0343) BP: (121-171)/(76-115) 127/78 (12/08 0343) SpO2:  [96 %-100 %] 96 % (12/08 0343)  Physical Exam:  General: alert and no distress  Heart: Normal rate noted Respiratory: Normal breath sounds, no difficulty Uterine Fundus: firm, mildly tender   Incision: no significant drainage, dressing in place C/D/I DVT Evaluation: No evidence of DVT seen on physical exam. Negative Homan's sign. No cords or calf tenderness. No significant calf/ankle edema.  Labs: CBC Latest Ref Rng & Units 08/23/2020 08/22/2020 08/21/2020  WBC 4.0 - 10.5 K/uL 11.0(H) 10.6(H) 9.1  Hemoglobin 12.0 - 15.0 g/dL 11.2(L) 12.5 12.4  Hematocrit 36 - 46 % 34.0(L) 37.0 37.3  Platelets 150 - 400 K/uL 176 173 200   CMP Latest Ref Rng & Units 08/22/2020 08/21/2020 06/06/2012  Glucose 70 - 99 mg/dL 90 64(Q) 034(V)  BUN 6 - 20 mg/dL 10 14 13   Creatinine 0.44 - 1.00 mg/dL 4.25 9.56  Sodium 135 - 145 mmol/L 131(L) 137 132(L)  Potassium 3.5 - 5.1 mmol/L 4.2 4.1 3.6  Chloride 98 - 111 mmol/L 104 108 101  CO2 22 - 32 mmol/L 19(L) 19(L) 21  Calcium 8.9 - 10.3 mg/dL 7.0(L) 8.5(L) 8.9  Total Protein 6.5 - 8.1 g/dL 3.87) 5.6(E) 6.6  Total Bilirubin 0.3 - 1.2 mg/dL 0.6 0.5 0.3  Alkaline Phos 38 - 126 U/L 234(H) 234(H) 194(H)  AST 15 - 41 U/L 44(H) 44(H) 17  ALT 0 - 44 U/L 34 32 15     Assessment/Plan: Status post Cesarean section. Doing well  postoperatively.  Continue magnesium sulfate for 24 hours postpartum Nifedipine XL 30 mg po qd started yesterday, increased dosage to 60 mg for better control Analgesia as needed Encourage OOB Nexplanon desired for contraception, will get at [facility] Routine postoperative and postpartum care   [facility], MD 08/23/2020, 7:31 AM

## 2020-08-22 NOTE — Anesthesia Preprocedure Evaluation (Signed)
Anesthesia Evaluation  Patient identified by MRN, date of birth, ID band Patient awake    Reviewed: Allergy & Precautions, NPO status , Patient's Chart, lab work & pertinent test results  Airway Mallampati: II  TM Distance: >3 FB Neck ROM: Full    Dental no notable dental hx. (+) Teeth Intact, Dental Advisory Given   Pulmonary Current Smoker,    Pulmonary exam normal breath sounds clear to auscultation       Cardiovascular hypertension, Pt. on medications Normal cardiovascular exam Rhythm:Regular Rate:Normal     Neuro/Psych negative neurological ROS  negative psych ROS   GI/Hepatic negative GI ROS, Neg liver ROS,   Endo/Other  negative endocrine ROS  Renal/GU negative Renal ROS     Musculoskeletal negative musculoskeletal ROS (+)   Abdominal   Peds  Hematology Lab Results      Component                Value               Date                      WBC                      10.6 (H)            08/22/2020                HGB                      12.5                08/22/2020                HCT                      37.0                08/22/2020                MCV                      86.4                08/22/2020                PLT                      173                 08/22/2020              Anesthesia Other Findings   Reproductive/Obstetrics (+) Pregnancy                             Anesthesia Physical Anesthesia Plan  ASA: III and emergent  Anesthesia Plan: Spinal   Post-op Pain Management:    Induction: Intravenous  PONV Risk Score and Plan: Treatment may vary due to age or medical condition, Ondansetron and Dexamethasone  Airway Management Planned: Nasal Cannula and Natural Airway  Additional Equipment: None  Intra-op Plan:   Post-operative Plan:   Informed Consent: I have reviewed the patients History and Physical, chart, labs and discussed the procedure including  the risks, benefits and alternatives for the proposed anesthesia with the patient or authorized representative who has  indicated his/her understanding and acceptance.     Dental advisory given and Interpreter used for interveiw  Plan Discussed with: CRNA and Anesthesiologist  Anesthesia Plan Comments: (K2H0 w sever Pre E for c/s)        Anesthesia Quick Evaluation

## 2020-08-22 NOTE — Progress Notes (Signed)
Labor Progress Note Kaitlin Koch is a 34 y.o. [phone] at [redacted]w[redacted]d presented for IOL secondary to preeclampsia with severe features (based on HA and Bps).  S: Patient resting in bed. Continues to endorse HA.  O:  BP (!) 147/105   Pulse 74   Temp 98.7 F (37.1 C) (Oral)   Resp 16   Ht 5\' 2"  (1.575 m)   Wt 90.9 kg   SpO2 98%   BMI 36.65 kg/m  EFM: baseline 125/ moderate variability/+accels/intermittent variable decels Toco: q4-5  CVE: Dilation: 5 Effacement (%): 50 Cervical Position: Posterior Station: -3 Presentation: Vertex Exam by:: Tabias Swayze MD  A&P: 34 y.o. 20 [redacted]w[redacted]d presented for IOL secondary to preeclampsia with severe features (based on HA and Bps).  #IOL: Called to bedside give recurrent variables. Amnioinfusion given and FSE placed. #Pain: pt does not plan for epidural #FWB: Category 2 strip given intermittent variable decels. Reassuringly, +accels. #GBS: unknown> PCN complete #Preeclampsia with SF: Magnesium started on admission. Continues to endorse HA. Received Fioricet, Tylenol and Flexeril with minimal improvement.  Had to receive labetalol x2 in last 4 hours for severe range pressures. Last received at 1215.  [redacted]w[redacted]d, MD PGY-1 08/22/2020 1:25 PM

## 2020-08-22 NOTE — Discharge Instructions (Signed)

## 2020-08-22 NOTE — Progress Notes (Incomplete)
Postpartum Day 1: Cesarean Delivery at [redacted]w[redacted]d after failed IOL for severe preeclampsia Subjective:  Patient is Spanish-speaking only, interpreter used for this encounter. Patient denies any headaches, visual symptoms, RUQ/epigastric pain or other concerning symptoms. Patient reports {sub:3041132}.    Objective: Vital signs in last 24 hours: Temp:  [97.7 F (36.5 C)-98.8 F (37.1 C)] 98 F (36.7 C) (12/07 2152) Pulse Rate:  [57-81] 65 (12/07 2152) Resp:  [14-21] 18 (12/07 2152) BP: (109-171)/(71-115) 144/98 (12/07 2152) SpO2:  [97 %-100 %] 98 % (12/07 2152)  Physical Exam:  General: {Exam; general:16600} Lochia: {Desc; appropriate/inappropriate:30686::"appropriate"} Uterine Fundus: {Desc; firm/soft:30687} Incision: {Exam; incision:13523} DVT Evaluation: {Exam; dvt:13533}  Labs: CBC Latest Ref Rng & Units 08/22/2020 08/21/2020 08/07/2020  WBC 4.0 - 10.5 K/uL 10.6(H) 9.1 -  Hemoglobin 12.0 - 15.0 g/dL 62.8 36.6 29.4  Hematocrit 36 - 46 % 37.0 37.3 37  Platelets 150 - 400 K/uL 173 200 264   CMP Latest Ref Rng & Units 08/22/2020 08/21/2020 06/06/2012  Glucose 70 - 99 mg/dL 90 76(L) 465(K)  BUN 6 - 20 mg/dL 10 14 13   Creatinine 0.44 - 1.00 mg/dL 3.54 6.56  Sodium 135 - 145 mmol/L 131(L) 137 132(L)  Potassium 3.5 - 5.1 mmol/L 4.2 4.1 3.6  Chloride 98 - 111 mmol/L 104 108 101  CO2 22 - 32 mmol/L 19(L) 19(L) 21  Calcium 8.9 - 10.3 mg/dL 7.0(L) 8.5(L) 8.9  Total Protein 6.5 - 8.1 g/dL 8.12) 7.5(T) 6.6  Total Bilirubin 0.3 - 1.2 mg/dL 0.6 0.5 0.3  Alkaline Phos 38 - 126 U/L 234(H) 234(H) 194(H)  AST 15 - 41 U/L 44(H) 44(H) 17  ALT 0 - 44 U/L 34 32 15     Assessment/Plan: Status post Cesarean section. Doing well postoperatively.  Continue magnesium sulfate for 24 hours postpartum  Cyndie Woodbeck 08/22/2020, 11:03 PM

## 2020-08-22 NOTE — Discharge Summary (Signed)
Postpartum Discharge Summary      Patient Name: Glenora Morocho DOB: 12-23-1985 MRN: [id number]  Date of admission: 08/21/2020 Delivery date:08/22/2020  Delivering provider: Verita Schneiders A  Date of discharge: 08/25/2020  Admitting diagnosis: Severe pre-eclampsia [O14.10] Intrauterine pregnancy: [redacted]w[redacted]d    Secondary diagnosis:  Active Problems:   Supervision of high risk pregnancy, antepartum   S/P cesarean section for NRFHT, FTP   Severe Pre-eclampsia, delivered   Chorioamnionitis, delivered, current hospitalization  Additional problems: none    Discharge diagnosis: Preterm Pregnancy Delivered and Preeclampsia (severe)                                              Augmentation: AROM, Pitocin and IP Foley Complications: Placental Abruption  Hospital course: Induction of Labor With Cesarean Section   34y.o. yo [id number] admitted to the hospital 08/21/2020 for induction of labor. Patient had a labor course significant for presentation from clinic for newly diagnosed pre-eclampsia with severe features (blood pressure and headache). She was admitted to L&D and started on magnesium. Given her fetal heart tracing decision was made not to initiate cytotec, but rather to proceed with IP FB and pitocin for augmentation. Given patient's recurrent late and variable decelerations patient required a pitocin break and then was later restarted on pitocin. She failed to make cervical change for approx 10 hours and continued to have recurrent late and variable decelerations so decision was made to proceed with cesarean section. The patient went for cesarean section due to Arrest of Dilation and Non-Reassuring FHR. Delivery details are as follows: Membrane Rupture Time/Date: 7:43 AM ,08/22/2020   Delivery Method:C-Section, Low Transverse  Details of operation can be found in separate operative Note.  Patient had an uncomplicated postpartum course. She received magnesium sulfate for  seizure prophylaxis for 24 hours following delivery. She is ambulating, tolerating a regular diet, passing flatus, and urinating well.  Patient is discharged home in stable condition on 08/25/20.      Newborn Data: Birth date:08/22/2020  Birth time:4:15 PM  Gender:Female  Living status:Living  Apgars:8 ,9  Weight:1870 g                                 Magnesium Sulfate received: Yes: Seizure prophylaxis BMZ received: No Rhophylac:N/A MMR:N/A   Physical exam  Vitals:   08/25/20 0449 08/25/20 0908 08/25/20 0909 08/25/20 1015  BP: 138/87  138/86 (!) 150/96  Pulse: 75  91 88  Resp: 18  17   Temp: 97.9 F (36.6 C) 98.9 F (37.2 C)    TempSrc: Oral Oral    SpO2: 99%  100%   Weight:      Height:       General: alert, cooperative and no distress Lochia: appropriate Uterine Fundus: firm Incision: honeycomb dressing clean, dry and intact DVT Evaluation: No evidence of DVT seen on physical exam. Labs: Lab Results  Component Value Date   WBC 11.0 (H) 08/23/2020   HGB 11.2 (L) 08/23/2020   HCT 34.0 (L) 08/23/2020   MCV 89.0 08/23/2020   PLT 176 08/23/2020   CMP Latest Ref Rng & Units 08/23/2020  Glucose 70 - 99 mg/dL 127(H)  BUN 6 - 20 mg/dL 10  Creatinine 0.44 - 1.00 mg/dL 0.96  Sodium 135 - 145  mmol/L 130(L)  Potassium 3.5 - 5.1 mmol/L 4.8  Chloride 98 - 111 mmol/L 101  CO2 22 - 32 mmol/L 20(L)  Calcium 8.9 - 10.3 mg/dL 6.2(LL)  Total Protein 6.5 - 8.1 g/dL 5.0(L)  Total Bilirubin 0.3 - 1.2 mg/dL 0.6  Alkaline Phos 38 - 126 U/L 195(H)  AST 15 - 41 U/L 42(H)  ALT 0 - 44 U/L 29   Edinburgh Score: No flowsheet data found.   After visit meds:  Allergies as of 08/25/2020      Reactions   Pork-derived Products Rash      Medication List    STOP taking these medications   aspirin EC 81 MG tablet     TAKE these medications   acetaminophen 325 MG tablet Commonly known as: TYLENOL Take 325 mg by mouth every 6 (six) hours as needed. pain   ferrous sulfate 325 (65  FE) MG tablet Take 1 tablet (325 mg total) by mouth every other day. Start taking on: August 26, 2020   ibuprofen 600 MG tablet Commonly known as: ADVIL Take 1 tablet (600 mg total) by mouth every 6 (six) hours as needed.   NIFEdipine 60 MG 24 hr tablet Commonly known as: ADALAT CC Take 1 tablet (60 mg total) by mouth daily.   oxyCODONE-acetaminophen 5-325 MG tablet Commonly known as: PERCOCET/ROXICET Take 1 tablet by mouth every 4 (four) hours as needed for severe pain ((when tolerating fluids)).   prenatal multivitamin Tabs tablet Take 1 tablet by mouth daily.        Discharge home in stable condition Infant Feeding: Bottle and Breast Infant Disposition:NICU Discharge instruction: per After Visit Summary and Postpartum booklet. Activity: Advance as tolerated. Pelvic rest for 6 weeks.  Diet: routine diet Future Appointments: Future Appointments  Date Time Provider [place]  08/29/2020  1:30 PM [facility] [facility] [place]  09/25/2020 10:55 AM Griffin Basil, MD [facility]   Follow up Visit:  [place] [facility] Follow up on 08/29/2020.   Specialty: Obstetrics and Gynecology Why: As scheduled on 12/14 Contact information: [place] [id number] [phone]             Message sent to [facility] 08/22/20 by Sylvester Harder.   Please schedule this patient for a In person postpartum visit in 4 weeks with the following provider: Any provider. Additional Postpartum F/U:Incision check 1 week and BP check 1 week  High risk pregnancy complicated by: HTN Delivery mode:  C-Section, Low Transverse  Anticipated Birth Control:  Unsure   08/25/2020 Mora Bellman, MD

## 2020-08-22 NOTE — Op Note (Signed)
Kaitlin Koch PROCEDURE DATE: 08/22/2020  PREOPERATIVE DIAGNOSES: Intrauterine pregnancy at [redacted]w[redacted]d weeks gestation; failure to progress: arrest of dilation, non-reassuring fetal status and severe preeclampsia  POSTOPERATIVE DIAGNOSES: The same; placental abruption  PROCEDURE: Primary Low Transverse Cesarean Section  SURGEON:  Dr. Jaynie Collins  ASSISTANT:  Dr. Mart Piggs  ANESTHESIOLOGY TEAM: Anesthesiologist: Trevor Iha, MD CRNA: Lenox Ahr, CRNA  INDICATIONS: Kaitlin Koch is a 34 y.o. [phone] at [redacted]w[redacted]d here for cesarean section secondary to the indications listed under preoperative diagnoses; please see preoperative note for further details.  The risks of surgery were discussed with the patient including but were not limited to: bleeding which may require transfusion or reoperation; infection which may require antibiotics; injury to bowel, bladder, ureters or other surrounding organs; injury to the fetus; need for additional procedures including hysterectomy in the event of a life-threatening hemorrhage; formation of adhesions; placental abnormalities wth subsequent pregnancies; incisional problems; thromboembolic phenomenon and other postoperative/anesthesia complications.  The patient concurred with the proposed plan, giving informed written consent for the procedure.    FINDINGS:  Viable female infant in cephalic presentation.  Tight nuchal cord x 1, also moderate placental abruption noted. Apgars 8/9, arterial cord pH pending at time of note.  Weight 1870 g.  Moderate meconium noted in amniotic fluid.  Intact placenta with moderate retroplacental clot and bright red blood, three vessel cord.  Normal uterus, fallopian tubes and ovaries bilaterally.  ANESTHESIA: Spinal INTRAVENOUS FLUIDS: 1200 ml   ESTIMATED BLOOD LOSS: 350 ml URINE OUTPUT:  150 ml SPECIMENS: Placenta sent to pathology COMPLICATIONS: None immediate  PROCEDURE IN DETAIL:  The patient  preoperatively received intravenous antibiotics and had sequential compression devices applied to her lower extremities.  She was then taken to the operating room where spinal anesthesia was administered and was found to be adequate. She was then placed in a dorsal supine position with a leftward tilt, and prepped and draped in a sterile manner.  A foley catheter was placed into her bladder and attached to constant gravity.  After an adequate timeout was performed, a Pfannenstiel skin incision was made with scalpel and carried through to the underlying layer of fascia. The fascia was incised in the midline, and this incision was extended bilaterally using the Mayo scissors.  Kocher clamps were applied to the superior aspect of the fascial incision and the underlying rectus muscles were dissected off bluntly and sharply.  The rectus muscles were separated in the midline and the peritoneum was entered bluntly. The Alexis self-retaining retractor was introduced into the abdominal cavity.  Attention was turned to the lower uterine segment where a low transverse hysterotomy was made with a scalpel and extended bilaterally bluntly.  The infant was successfully delivered, the cord was clamped and cut after one minute, and the infant was handed over to the awaiting neonatology team. Uterine massage was then administered, and the placenta delivered intact with a three-vessel cord. The uterus was then cleared of clots and debris.  The hysterotomy was closed with 0 Vicryl in a running locked fashion, and an imbricating layer was also placed with 0 Vicryl. Figure-of-eight 0 Vicryl serosal stitches were placed to help with hemostasis, Surgicel also placed.  The pelvis was cleared of all clot and debris. Hemostasis was confirmed on all surfaces.  The retractor was removed.  The peritoneum was closed with a 0 Vicryl running stitch and the rectus muscles were reapproximated using 0 Vicryl interrupted stitches. The fascia was then  closed using 0 Vicryl  in a running fashion.  The subcutaneous layer was irrigated, reapproximated with 2-0 plain gut interrupted stitches, and the skin was closed with a 4-0 Vicryl subcuticular stitch. The patient tolerated the procedure well. Sponge, instrument and needle counts were correct x 3.  She was taken to the recovery room in stable condition.    Jaynie Collins, MD, FACOG Obstetrician & Gynecologist, [facility] for [employer], [facility]

## 2020-08-22 NOTE — Progress Notes (Signed)
Labor Progress Note Kaitlin Koch is a 34 y.o. [phone] at [redacted]w[redacted]d presented for IOL secondary to preeclampsia with severe features (based on HA and Bps).  S: Pt reports continued HA. Just received Fioricet. Feels it is slightly improved with morning coffee.  O:  BP (!) 144/97   Pulse 76   Temp 98.8 F (37.1 C) (Oral)   Resp 16   Ht 5\' 2"  (1.575 m)   Wt 90.9 kg   SpO2 98%   BMI 36.65 kg/m  EFM: baseline 125/ moderate variability/+accels/intermittent variable decels Toco: q4-5  CVE: Dilation: 5 Effacement (%): 50 Cervical Position: Posterior Station: -2 Presentation: Vertex Exam by:: [place] MD  A&P: 34 y.o. 20 [redacted]w[redacted]d presented for IOL secondary to preeclampsia with severe features (based on HA and Bps). #IOL: AROM and IUPC @ 0740. Pit restarted at 0517. Will continue to titrate pitocin.   #Pain: pt does not plan for epidural #FWB: Category 2 strip given intermittent variable decels. Reassuringly, +accels. #GBS unknown. (PCR swab negative on admission but given pt is preterm GBS culture was also collected and PCN was initiated at 2038.) #Preeclampsia with SF: Magnesium started on admission. Continues to endorse HA. Just received Fioricet. BP continue to be mild range over last 6 hours, no severe range. Last received labetalol at 0244.   2039, MD PGY-1 08/22/2020 9:15 AM

## 2020-08-22 NOTE — Progress Notes (Signed)
Labor Progress Note Kaitlin Koch is a 34 y.o. [phone] at [redacted]w[redacted]d presented for IOL secondary to preeclampsia with severe features (based on HA and Bps).  S: Pt reports HA somewhat improved.  O:  BP (!) 158/101   Pulse 66   Temp 97.9 F (36.6 C) (Oral)   Resp 17   Ht 5\' 2"  (1.575 m)   Wt 90.9 kg   SpO2 98%   BMI 36.65 kg/m  EFM: baseline 125/ moderate variability/+accels/intermittent variable decels Toco: q4-5  CVE: Dilation: 5 Effacement (%): 50 Cervical Position: Posterior Station: -3 Presentation: Vertex Exam by:: [name]  A&P: 34 y.o. 20 [redacted]w[redacted]d presented for IOL secondary to preeclampsia with severe features (based on HA and Bps).  #IOL: Pit @ 14, will continue to titrate until adequate contractions.  #Pain: pt does not plan for epidural #FWB: Category 2 strip given intermittent variable decels. Reassuringly, +accels. #GBS: unknown> PCN complete #Preeclampsia with SF: Magnesium started on admission. Continues to endorse HA. Received Fioricet, Tylenol and Flexeril with minimal improvement.  Had to receive labetalol x2 in last 4 hours for severe range pressures. Last received at 1215.  [redacted]w[redacted]d, MD PGY-1 08/22/2020 12:36 PM

## 2020-08-22 NOTE — Progress Notes (Signed)
Labor Progress Note Kaitlin Koch is a 34 y.o. [phone] at [redacted]w[redacted]d presented for IOL secondary to preeclampsia with severe features (based on HA and Bps).  S: Pt reports mild HA, consistent with prior. Pt now feeling mild contractions with FB in place. No concerns at this time.  O:  BP 118/80   Pulse 75   Temp 98.2 F (36.8 C) (Oral)   Resp 20   Ht 5\' 2"  (1.575 m)   Wt 90.9 kg   SpO2 98%   BMI 36.65 kg/m  EFM: baseline 110/minimal to moderate variability/+accels/intermittent variable decels Toco: irregular  CVE: Dilation: 2.5 Effacement (%): 50 Station: -3 Presentation: Vertex Exam by:: Dr [email or web address]   A&P: 34 y.o. 20 [redacted]w[redacted]d presented for IOL secondary to preeclampsia with severe features (based on HA and Bps). #IOL: S/p FB placement at 2115. Low dose pitocin started at 2140. Reassuringly, cervical exam progressing. Will plan to up-titrate pitocin when FB is out. #Pain: pt does not plan for epidural #FWB: Category 2 strip given minimal to moderate variability and intermittent variable decels. Reassuringly, +accels. #GBS unknown. (PCR swab negative on admission but given pt is preterm GBS culture was also collected and PCN was initiated at 2038.) #Preeclampsia with SF: Magnesium started on admission at 1938. Pt reports mild HA, consistent from prior. Pt has required IV anti-hypertensives at 1931 and also at 2100. Most recently blood pressures in mild to normal range. Will continue to monitor closely.  2101, MD OB Fellow, [facility] 08/22/2020 2:08 AM

## 2020-08-23 ENCOUNTER — Encounter (HOSPITAL_COMMUNITY): Payer: Self-pay | Admitting: Obstetrics and Gynecology

## 2020-08-23 DIAGNOSIS — Z98891 History of uterine scar from previous surgery: Secondary | ICD-10-CM

## 2020-08-23 DIAGNOSIS — O1415 Severe pre-eclampsia, complicating the puerperium: Secondary | ICD-10-CM

## 2020-08-23 LAB — COMPREHENSIVE METABOLIC PANEL
ALT: 29 U/L (ref 0–44)
AST: 42 U/L — ABNORMAL HIGH (ref 15–41)
Albumin: 1.8 g/dL — ABNORMAL LOW (ref 3.5–5.0)
Alkaline Phosphatase: 195 U/L — ABNORMAL HIGH (ref 38–126)
Anion gap: 9 (ref 5–15)
BUN: 10 mg/dL (ref 6–20)
CO2: 20 mmol/L — ABNORMAL LOW (ref 22–32)
Calcium: 6.2 mg/dL — CL (ref 8.9–10.3)
Chloride: 101 mmol/L (ref 98–111)
Creatinine, Ser: 0.96 mg/dL (ref 0.44–1.00)
GFR, Estimated: >60 mL/min (ref >60)
Glucose, Bld: 127 mg/dL — ABNORMAL HIGH (ref 70–99)
Potassium: 4.8 mmol/L (ref 3.5–5.1)
Sodium: 130 mmol/L — ABNORMAL LOW (ref 135–145)
Total Bilirubin: 0.6 mg/dL (ref 0.3–1.2)
Total Protein: 5 g/dL — ABNORMAL LOW (ref 6.5–8.1)

## 2020-08-23 LAB — CBC
HCT: 34 % — ABNORMAL LOW (ref 36.0–46.0)
Hemoglobin: 11.2 g/dL — ABNORMAL LOW (ref 12.0–15.0)
MCH: 29.3 pg (ref 26.0–34.0)
MCHC: 32.9 g/dL (ref 30.0–36.0)
MCV: 89 fL (ref 80.0–100.0)
Platelets: 176 10*3/uL (ref 150–400)
RBC: 3.82 MIL/uL — ABNORMAL LOW (ref 3.87–5.11)
RDW: 16.4 % — ABNORMAL HIGH (ref 11.5–15.5)
WBC: 11 10*3/uL — ABNORMAL HIGH (ref 4.0–10.5)
nRBC: 0 % (ref 0.0–0.2)

## 2020-08-23 LAB — STREP GP B NAA: Strep Gp B NAA: NEGATIVE

## 2020-08-23 LAB — MAGNESIUM: Magnesium: 7.7 mg/dL (ref 1.7–2.4)

## 2020-08-23 MED ORDER — NIFEDIPINE ER OSMOTIC RELEASE 30 MG PO TB24
60.0000 mg | ORAL_TABLET | Freq: Every day | ORAL | Status: DC
  Administered 2020-08-23 – 2020-08-25 (×3): 60 mg via ORAL
  Filled 2020-08-23 (×3): qty 2

## 2020-08-23 NOTE — Progress Notes (Signed)
New order to hold mag for 2 hours and restart at 1 gm till end of order

## 2020-08-23 NOTE — Progress Notes (Incomplete)
CRITICAL VALUE ALERT  Critical Value:  ***mag 7.7   Calcium 6.7  Date & Time Notied:  ***08/23/2020  Provider Notified: ***DR CONSTANT  Orders Received/Actions taken: ***no change in orders

## 2020-08-23 NOTE — Lactation Note (Signed)
This note was copied from a baby's chart. Lactation Consultation Note  Patient Name: Kaitlin Koch [id number] Date: 08/23/2020 Reason for consult: Follow-up assessment  Consult was done in Spanish:   Follow up visit to P3 mother of 21 hours old preterm currently in NICU. Mother states she has pumped 3 times today and plans to pump more during the day.  Reinforced the importance of pumping as well as basics/cleaning/frequency. Talked about breast massage prior to pump.  Encouraged to contact [facility] for support and questions.   Maternal Data Formula Feeding for Exclusion: No  Feeding Feeding Type: Formula  Interventions Interventions: DEBP  Lactation Tools Discussed/Used Pump Review: Setup, frequency, and cleaning   Consult Status Consult Status: Follow-up Date: 08/24/20 Follow-up type: In-patient    Coben Godshall A Higuera Ancidey 08/23/2020, 1:23 PM

## 2020-08-23 NOTE — Progress Notes (Signed)
Patient screened out for psychosocial assessment since none of the following apply:  Psychosocial stressors documented in mother or baby's chart  Gestation less than 32 weeks  Code at delivery   Infant with anomalies Please contact the Clinical Social Worker if specific needs arise, by MOB's request, or if MOB scores greater than 9/yes to question 10 on Edinburgh Postpartum Depression Screen.  CSW will monitor infant's UDS and CDS for an abruption. If either are postive CSW will make a report to Child Protective Services.  Lucero Auzenne Boyd-Gilyard, MSW, LCSW Clinical Social Work [phone]    

## 2020-08-24 ENCOUNTER — Encounter (HOSPITAL_COMMUNITY): Payer: Self-pay | Admitting: Obstetrics & Gynecology

## 2020-08-24 DIAGNOSIS — O411290 Chorioamnionitis, unspecified trimester, not applicable or unspecified: Secondary | ICD-10-CM | POA: Diagnosis not present

## 2020-08-24 LAB — CULTURE, BETA STREP (GROUP B ONLY)

## 2020-08-24 LAB — SURGICAL PATHOLOGY

## 2020-08-24 MED ORDER — FERROUS SULFATE 325 (65 FE) MG PO TABS: 325.0000 mg | ORAL_TABLET | ORAL | Status: DC

## 2020-08-24 NOTE — Progress Notes (Signed)
Subjective: Postpartum Day 2: Cesarean Delivery Patient reports feeling well with some incisional pain. She is ambulating and tolerating a regular diet. She is voiding without difficulty. Patient denies nausea/emesis.    Objective: Vital signs in last 24 hours: Temp:  [97.7 F (36.5 C)-98.5 F (36.9 C)] 98.1 F (36.7 C) (12/09 0735) Pulse Rate:  [53-82] 69 (12/09 0735) Resp:  [18] 18 (12/09 0735) BP: (112-166)/(66-96) 128/90 (12/09 0735) SpO2:  [94 %-98 %] 98 % (12/09 0735)  Physical Exam:  General: alert, cooperative and no distress Lochia: appropriate Uterine Fundus: firm Incision: honeycomb dressing clean, dry and intact DVT Evaluation: No evidence of DVT seen on physical exam.  Recent Labs    08/22/20 1400 08/23/20 0634  HGB 12.5 11.2*  HCT 37.0 34.0*    Assessment/Plan: Status post Cesarean section. Doing well postoperatively.  HTN better controlled on 60 mg Procardia Discharge planning tomorrow Continue routine prenatal care.  Kaitlin Koch 08/24/2020, 11:30 AM

## 2020-08-24 NOTE — Lactation Note (Signed)
This note was copied from a baby's chart. Lactation Consultation Note  Patient Name: Boy Maggie Senseney OXBDZ'H Date: 08/24/2020 Reason for consult: Follow-up assessment  Consult was done in Spanish:  Follow up P3 mother of 70 hours old preterm currently in NICU. Mother has been discharged and plans to stay in NICU. Mother states she has been pumping but no volume to collect yet. Reinforced pumping 8-12 times in 24h. Promoted maternal self care and encouraged to contact lactation services for questions/concerns/support.   Mother is a [facility] participant and has an appointment to get a loaner DEBP hospital grade.     Maternal Data Formula Feeding for Exclusion: No  Feeding Feeding Type: Formula  Interventions Interventions: Breast feeding basics reviewed;Expressed milk;DEBP  Lactation Tools Discussed/Used Tools: Pump Breast pump type: Double-Electric Breast Pump WIC Program: Yes   Consult Status Consult Status: Follow-up Date: 08/25/20 Follow-up type: In-patient    Padraig Nhan A Higuera Ancidey 08/24/2020, 3:54 PM

## 2020-08-24 NOTE — Progress Notes (Signed)
CSW met with MOB at infant's bedside in room 310. CSW utilized interpreting services Elisha Headland [phone]) to assist with language barriers.  When CSW arrived, MOB was asleep with infant in her arms; MOB was easily awaken.  CSW had RN to place infant in bassinet and provided MOB with education regarding safe sleep and SIDS.  MOB stated, "I think my nurse gave me some medication that is making me sleepy." CSW offered to meet with MOB at a later time in effort for MOB to continue to nap but MOB declined. CSW explained CSW's role and made MOB aware that CSW was consulted due to MOB having questions regarding community resources and supports.  MOB asked about Medicaid for infant and CSW explained Medicaid application process;MOB thanked CSW for the information. CSW also encouraged MOB to update her [facility] and Food Stamp application by adding infant on;  MOB agreed.   CSW provided MOB with CSW's contact information and encouraged MOB to contact CSW if a need arises.   CSW is signing off.   Laurey Arrow, MSW, LCSW Clinical Social Work [phone]

## 2020-08-25 ENCOUNTER — Other Ambulatory Visit (HOSPITAL_COMMUNITY): Payer: Self-pay | Admitting: Obstetrics and Gynecology

## 2020-08-25 ENCOUNTER — Ambulatory Visit: Payer: Self-pay

## 2020-08-25 MED ORDER — OXYCODONE-ACETAMINOPHEN 5-325 MG PO TABS: 1.0000 | ORAL_TABLET | ORAL | 0 refills | Status: DC | PRN

## 2020-08-25 MED ORDER — NIFEDIPINE ER 60 MG PO TB24
60.0000 mg | ORAL_TABLET | Freq: Every day | ORAL | 3 refills | Status: DC
Start: 2020-08-25 — End: 2020-08-29

## 2020-08-25 MED ORDER — FERROUS SULFATE 325 (65 FE) MG PO TABS
325.0000 mg | ORAL_TABLET | ORAL | 3 refills | Status: DC
Start: 2020-08-26 — End: 2020-08-26

## 2020-08-25 MED ORDER — IBUPROFEN 600 MG PO TABS: 600.0000 mg | ORAL_TABLET | Freq: Four times a day (QID) | ORAL | 3 refills | Status: DC | PRN

## 2020-08-25 MED FILL — OXYCODONE-APAP 5-325MG: 5-325 | 5 days supply | Qty: 20 | Fill #0

## 2020-08-25 MED FILL — IBUPROFEN 600 MG TABLET: 600 | 15 days supply | Qty: 60 | Fill #0

## 2020-08-25 MED FILL — NIFEdipine ER 60 MG TB24: 60 | 30 days supply | Qty: 30 | Fill #0

## 2020-08-25 MED FILL — FERROUS SULFATE 325 MG TAB: 325 (65 FE) | 60 days supply | Qty: 30 | Fill #0

## 2020-08-25 NOTE — Lactation Note (Signed)
This note was copied from a baby's chart. Lactation Consultation Note  Patient Name: Kaitlin Koch QMGQQ'P Date: 08/25/2020 Reason for consult: Follow-up assessment;Late-preterm 34-36.6wks;Infant < 6lbs;Infant weight loss  Visited with mom of 16 hours old LPI NICU female < 4 lbs, she got discharge today but baby is still at the hospital. Reviewed engorgement prevention/treatment, treatment/prevention of sore nipples and breastmilk storage guidelines. Mom told LC that she has a phone appt with WIC today at 2 pm and that she needed to pick up her pump.  Mom is a P4 and has been pumping while at the hospital, but not consistently. She told LC she pumped 10 ml of EBM yesterday, praised her for her efforts. However she hasn't pumped today, encouraged her to do so every 2-3 hours. Reviewed pumping schedule and supply/demand. Baby is currently on 24 calorie formula and EBM through gavage feeding.  Mom is a [place] speaker, this consult took place in the NICU since she had left her room already. She reported all questions and concerns were answered, she's aware of LC OP services and will call PRN.   Maternal Data    Feeding Feeding Type: Breast Milk with Formula added  LATCH Score                   Interventions Interventions: Breast feeding basics reviewed;DEBP  Lactation Tools Discussed/Used Tools: Pump Breast pump type: Double-Electric Breast Pump   Consult Status Consult Status: Follow-up Date: 08/26/20 (check if mom got pump from [facility]) Follow-up type: In-patient    Kaitlin Koch 08/25/2020, 12:46 PM

## 2020-08-25 NOTE — Lactation Note (Signed)
This note was copied from a baby's chart. Patient Name: Kaitlin Koch SEGBT'D Date: 08/25/2020 Reason for consult: Other (Comment) (RN request)  Lactation Consultation Note LC to room at RN request to provide a 50mm nipple shield and instructions for use. RN concerned about mom's forceful letdown and nipple size compared to infant's mouth. LC provided requested support and offered to return to assist further prn.  Elder Negus, MA IBCLC 08/25/2020, 3:20 PM

## 2020-08-29 ENCOUNTER — Ambulatory Visit (INDEPENDENT_AMBULATORY_CARE_PROVIDER_SITE_OTHER): Payer: Self-pay | Admitting: *Deleted

## 2020-08-29 ENCOUNTER — Other Ambulatory Visit: Payer: Self-pay

## 2020-08-29 ENCOUNTER — Encounter: Payer: Self-pay | Admitting: *Deleted

## 2020-08-29 VITALS — BP 118/76 | HR 109 | Ht 62.0 in | Wt 185.7 lb

## 2020-08-29 DIAGNOSIS — Z0130 Encounter for examination of blood pressure without abnormal findings: Secondary | ICD-10-CM

## 2020-08-29 DIAGNOSIS — Z4889 Encounter for other specified surgical aftercare: Secondary | ICD-10-CM

## 2020-08-29 MED ORDER — HYDROCHLOROTHIAZIDE 25 MG PO TABS: 25.0000 mg | ORAL_TABLET | Freq: Every day | ORAL | 1 refills | Status: AC

## 2020-08-29 NOTE — Progress Notes (Signed)
Pt presents for incision check and BP check following C/S on 12/7. Interpreter Deatra Ina [phone] used for encounter. Pt reports having a H/A since d/c from hospital on 12/10. She also reports having occasional dizziness and sees sparks of light when having a H/A.  BP - 118/76,  P - 109. Incision was assessed and found to be well healed. No redness, swelling or drainage was observed. Proper cleaning instructions of incision were given. Consult w/Dr. Crissie Reese regarding pt's persistent H/A's. Orders were received for CMP today and d/c Nifedipine. HCTZ 25 mg daily prescribed as replacement for Nifedipine. Pt advised of medication changes. One grab and go bag of food was given to pt for Food insecurity present. She voiced understanding of all information given.

## 2020-08-30 ENCOUNTER — Ambulatory Visit: Payer: Self-pay

## 2020-08-30 LAB — COMPREHENSIVE METABOLIC PANEL
ALT: 80 IU/L — ABNORMAL HIGH (ref 0–32)
AST: 46 IU/L — ABNORMAL HIGH (ref 0–40)
Albumin/Globulin Ratio: 1.2 (ref 1.2–2.2)
Albumin: 3.3 g/dL — ABNORMAL LOW (ref 3.8–4.8)
Alkaline Phosphatase: 226 IU/L — ABNORMAL HIGH (ref 44–121)
BUN/Creatinine Ratio: 24 — ABNORMAL HIGH (ref 9–23)
BUN: 14 mg/dL (ref 6–20)
Bilirubin Total: <0.2 mg/dL (ref 0.0–1.2)
CO2: 23 mmol/L (ref 20–29)
Calcium: 8.6 mg/dL — ABNORMAL LOW (ref 8.7–10.2)
Chloride: 107 mmol/L — ABNORMAL HIGH (ref 96–106)
Creatinine, Ser: 0.59 mg/dL (ref 0.57–1.00)
GFR calc Af Amer: 138 mL/min/{1.73_m2} (ref >59)
GFR calc non Af Amer: 120 mL/min/{1.73_m2} (ref >59)
Globulin, Total: 2.8 g/dL (ref 1.5–4.5)
Glucose: 92 mg/dL (ref 65–99)
Potassium: 4.1 mmol/L (ref 3.5–5.2)
Sodium: 140 mmol/L (ref 134–144)
Total Protein: 6.1 g/dL (ref 6.0–8.5)

## 2020-08-30 NOTE — Lactation Note (Signed)
This note was copied from a baby's chart. Lactation Consultation Note LC to room for f/u visit at mom's request. Observed as few rhythmic suckling bursts with audible swallows but baby quickly tired out. Length of feed was under 5 minutes. Reviewed expectations and progress. Encouraged mom to continue feeding with readiness cues and pumping to maintain milk production. Lactation Consultant, Linels Higuera, was present for consult and assisted with communicating in mother's preferred language. Mother was provided with the opportunity to ask questions. All concerns were addressed. Will plan follow up visit.   Patient Name: Kaitlin Koch ENIDP'O Date: 08/30/2020 Reason for consult: Follow-up assessment;Mother's request;NICU baby  Consult Status Consult Status: Follow-up Follow-up type: In-patient    Elder Negus, MA IBCLC 08/30/2020, 8:29 PM

## 2020-08-31 NOTE — Progress Notes (Signed)
Chart reviewed for nurse visit. Agree with plan of care.   Patient also needs repeat chem panel at upcoming BP check with nurse on 09/04/2020.   Venora Maples, MD 08/31/20 4:10 PM

## 2020-09-02 ENCOUNTER — Ambulatory Visit: Payer: Self-pay

## 2020-09-02 NOTE — Lactation Note (Signed)
This note was copied from a baby's chart. Lactation Consultation Note  Patient Name: Kaitlin Koch FVCBS'W Date: 09/02/2020 Reason for consult: Follow-up assessment;NICU baby;Infant < 6lbs;Early term 37-38.6wks  LC in to assist with positioning and latching baby to the breast.  Mom using cradle position and scissor hold on breast.  Assisted Mom to use cross cradle hold to better control baby's latch and support breast well.  Baby able to latch deeply, pulling down on chin gently to flange lower lip.  Swallows identified.  Baby became sleepy after 7 minutes. Switched from cross cradle to football hold.  Tried again using a nipple shield (20 mm) as baby would lose depth on the breast periodically.   Baby too sleepy to latch.    RN to start gavage feeding.   Talked to Mom about importance of consistent pumping.  Mom hasn't been pumping in last few days and baby has been getting formula supplements.  Explained to Mom that baby is too little with immature feeding skills to support a full milk supply without any pumping.   Set Mom up to double pump using Symphony pump in room.  Mom very appreciative of assistance.  Feeding Feeding Type: Breast Fed  LATCH Score Latch: Grasps breast easily, tongue down, lips flanged, rhythmical sucking.  Audible Swallowing: Spontaneous and intermittent  Type of Nipple: Everted at rest and after stimulation  Comfort (Breast/Nipple): Soft / non-tender  Hold (Positioning): Assistance needed to correctly position infant at breast and maintain latch.  LATCH Score: 9  Interventions Interventions: Breast feeding basics reviewed;Assisted with latch;Skin to skin;Breast massage;Hand express;Breast compression;Adjust position;Support pillows;Position options;DEBP;Expressed milk  Lactation Tools Discussed/Used Tools: Pump;Bottle Breast pump type: Double-Electric Breast Pump   Consult Status Consult Status: Follow-up Date: 09/04/20 Follow-up type:  In-patient    Kaitlin Koch 09/02/2020, 11:32 AM

## 2020-09-04 ENCOUNTER — Ambulatory Visit: Payer: Self-pay

## 2020-09-06 ENCOUNTER — Ambulatory Visit: Payer: Self-pay

## 2020-09-06 NOTE — Lactation Note (Addendum)
This note was copied from a baby's chart. Lactation Consultation Note  Patient Name: Kaitlin Koch OMVEH'M Date: 09/06/2020 Reason for consult: Follow-up assessment;NICU baby;Infant < 6lbs;Early term 37-38.6wks  LC in to assist/assess baby at the breast.    [name] is 55 weeks old AGA [redacted]w[redacted]d weight today 4 lbs. 13.4 oz. Baby latched in cradle hold.  Baby's body resting in Mom's lap and baby latched onto breast with lower lip tucked.  Placed pillow support under baby and showed Mom how to untuck lower lip for a deeper and wider latch to breast.    Baby noted to be nutritive and sucking consistently with swallows. Mom noted to be pulling breast away from baby's nose.  When baby elevated on pillow support, chin was positioned closer and nose was away from breast.  Encouraged Mom to support and compress her breast during feeding.  Plan is to breastfeed baby alternating with higher calorie formula supplements.  Recommended to Mom that she double pump when baby is fed formula.  Mom said "of course".    Talked to Neonatologist about baby going to [facility] where an IBCLC can provide lactation follow-up. Concern regarding baby's small size.  Update- baby will be seen at [facility] and Kaitlin Koch OP LC at [facility] will follow-up.  Message sent.  Feeding Feeding Type: Breast Fed  LATCH Score Latch: Grasps breast easily, tongue down, lips flanged, rhythmical sucking.  Audible Swallowing: Spontaneous and intermittent  Type of Nipple: Everted at rest and after stimulation  Comfort (Breast/Nipple): Soft / non-tender  Hold (Positioning): Assistance needed to correctly position infant at breast and maintain latch.  LATCH Score: 9  Interventions Interventions: Breast feeding basics reviewed;Assisted with latch;Skin to skin;Breast massage;Hand express;Breast compression;Adjust position;DEBP;Expressed milk;Position options;Support pillows   Consult  Status Consult Status: Complete Date: 09/06/20 Follow-up type: Call as needed    Kaitlin Koch 09/06/2020, 2:26 PM

## 2020-09-25 ENCOUNTER — Other Ambulatory Visit: Payer: Self-pay

## 2020-09-25 ENCOUNTER — Encounter: Payer: Self-pay | Admitting: Obstetrics and Gynecology

## 2020-09-25 ENCOUNTER — Ambulatory Visit (INDEPENDENT_AMBULATORY_CARE_PROVIDER_SITE_OTHER): Payer: Self-pay | Admitting: Obstetrics and Gynecology

## 2020-09-25 VITALS — BP 135/95 | HR 64 | Ht 59.0 in | Wt 184.4 lb

## 2020-09-25 DIAGNOSIS — O1414 Severe pre-eclampsia complicating childbirth: Secondary | ICD-10-CM

## 2020-09-25 DIAGNOSIS — K5900 Constipation, unspecified: Secondary | ICD-10-CM

## 2020-09-25 DIAGNOSIS — O1093 Unspecified pre-existing hypertension complicating the puerperium: Secondary | ICD-10-CM

## 2020-09-25 DIAGNOSIS — Z30017 Encounter for initial prescription of implantable subdermal contraceptive: Secondary | ICD-10-CM

## 2020-09-25 DIAGNOSIS — Z98891 History of uterine scar from previous surgery: Secondary | ICD-10-CM

## 2020-09-25 DIAGNOSIS — O99893 Other specified diseases and conditions complicating puerperium: Secondary | ICD-10-CM

## 2020-09-25 DIAGNOSIS — Z3202 Encounter for pregnancy test, result negative: Secondary | ICD-10-CM

## 2020-09-25 LAB — POCT PREGNANCY, URINE: Preg Test, Ur: NEGATIVE

## 2020-09-25 MED ORDER — DOCUSATE SODIUM 100 MG PO CAPS: 100.0000 mg | ORAL_CAPSULE | Freq: Two times a day (BID) | ORAL | 2 refills | Status: AC | PRN

## 2020-09-25 MED ORDER — ETONOGESTREL 68 MG ~~LOC~~ IMPL
68.0000 mg | DRUG_IMPLANT | Freq: Once | SUBCUTANEOUS | Status: AC
  Administered 2020-09-25: 68 mg via SUBCUTANEOUS

## 2020-09-25 NOTE — Progress Notes (Signed)
Nexplanon Insertion Procedure Note  Patient was identified. Informed consent was signed, signed copy in chart. A time-out was performed.    The insertion site was identified 8-10 cm (3-4 inches) from the medial epicondyle of the humerus and 3-5 cm (1.25-2 inches) posterior to (below) the sulcus (groove) between the biceps and triceps muscles of the patient's right arm and marked. The site was prepped and draped in the usual sterile fashion. Pt was prepped with alcohol swab and then injected with 6 cc of 1% lidocaine. The site was prepped with betadine. Nexplanon removed form packaging,  Device confirmed in needle, then inserted full length of needle and withdrawn per handbook instructions. Provider and patient verified presence of the implant in the woman's arm by palpation. Pt insertion site was covered with steristrips/adhesive bandage and pressure bandage. There was minimal blood loss. Patient tolerated procedure well.  Patient was given post procedure instructions and Nexplanon user card with expiration date. Condoms were recommended for STI prevention. Patient was asked to keep the pressure dressing on for 24 hours to minimize bruising and keep the adhesive bandage on for 3-5 days. The patient verbalized understanding of the plan of care and agrees.   Mariel Aloe, Md Faculty Attending, [facility] for [employer]  Lot # [id number]  Expiration Date11/21/2023

## 2020-09-25 NOTE — Patient Instructions (Signed)
Nexplanon Instructions After Insertion  Keep bandage clean and dry for 24 hours  May use ice/Tylenol/Ibuprofen for soreness or pain  If you develop fever, drainage or increased warmth from incision site-contact office immediately   

## 2020-09-25 NOTE — Progress Notes (Signed)
Post Partum Visit Note  Kaitlin Koch is a 35 y.o. [id number] female who presents for a postpartum visit. She is 4 weeks postpartum following a primary cesarean section.  I have fully reviewed the prenatal and intrapartum course. The delivery was at 36/4 gestational weeks.  Anesthesia: spinal. Postpartum course has been uncomplicated. Baby is doing well. Baby is feeding by both breast and bottle - Carnation Good Start. Bleeding no bleeding. Bowel function is constipated. Last bowel movement this morning.  Bladder function is normal. Patient is not sexually active. Contraception method is none. Postpartum depression screening: negative.   The pregnancy intention screening data noted above was reviewed. Potential methods of contraception were discussed. The patient elected to proceed with Hormonal Implant.    Edinburgh Postnatal Depression Scale - 09/25/20 1127      Edinburgh Postnatal Depression Scale:  In the Past 7 Days   I have been able to laugh and see the funny side of things. 0    I have looked forward with enjoyment to things. 0    I have blamed myself unnecessarily when things went wrong. 0    I have been anxious or worried for no good reason. 0    I have felt scared or panicky for no good reason. 0    Things have been getting on top of me. 1    I have been so unhappy that I have had difficulty sleeping. 0    I have felt sad or miserable. 0    I have been so unhappy that I have been crying. 0    The thought of harming myself has occurred to me. 0    Edinburgh Postnatal Depression Scale Total 1             The following portions of the patient's history were reviewed and updated as appropriate: allergies, current medications, past family history, past medical history, past social history, past surgical history and problem list.  Review of Systems Pertinent items are noted in HPI.   Vitals:   09/25/20 1120  BP: (!) 135/95  Pulse: 64    Objective:  BP (!) 135/95  (BP Location: Left Arm)   Pulse 64   Ht 4\' 11"  (1.499 m)   Wt 184 lb 6.4 oz (83.6 kg)   BMI 37.24 kg/m    General:  alert, cooperative, no distress and moderately obese   Breasts:  deferred  Lungs: clear to auscultation bilaterally  Heart:  regular rate and rhythm  Abdomen: soft, non-tender; bowel sounds normal; no masses,  no organomegaly and c/s scar well healed, small piece of suture removed on the left side   Vulva:  normal  Vagina: normal vagina  Cervix:  cvx WNL, small amount of bloody mucus noted, no active bleeding  Corpus: normal size, contour, position, consistency, mobility, non-tender  Adnexa:  not evaluated  Rectal Exam: visually no obvious hemorrhoids, no active bleeding        Assessment:    normal postpartum exam. Pap smear not done at today's visit.   Plan:   Essential components of care per ACOG recommendations:  1.  Mood and well being: Patient with negative depression screening today. Reviewed local resources for support.  - Patient does not use tobacco.  - hx of drug use? No    2. Infant care and feeding:  -Patient currently breastmilk feeding? Yes  If breastmilk feeding discussed return to work and pumping. If needed, patient was provided letter for work to  allow for every 2-3 hr pumping breaks, and to be granted a private location to express breastmilk and refrigerated area to store breastmilk. Reviewed importance of draining breast regularly to support lactation. -Social determinants of health (SDOH) reviewed in EPIC. No concerns  3. Sexuality, contraception and birth spacing - Patient does not want a pregnancy in the next year.  Desired family size is 4 children.  - Reviewed forms of contraception in tiered fashion. Patient desired nexplanon today.   - Discussed birth spacing of 18 months  4. Sleep and fatigue -Encouraged family/partner/community support of 4 hrs of uninterrupted sleep to help with mood and fatigue  5. Physical Recovery  -  Discussed patients delivery and complications - Patient had cesarean section. C section scar healing well. Patient expressed understanding - Patient has urinary incontinence? No-  Patient is safe to resume physical and sexual activity  6.  Health Maintenance - Last pap smear done 11/15/19 and was normal with negative HPV.  7. hypertension - PCP follow up, pt advised to follow up with health department in 2-3 months   8 constipation/bleeding:  rx for colace given Warden Fillers, MD [facility], [facility]

## 2020-09-25 NOTE — Progress Notes (Signed)
Pt wants Nexplanon also states having pain when having bowel movement.

## 2022-08-25 IMAGING — US US FETAL BPP W/ NON-STRESS
1 series · 14 of 14 positions shown · non-contrast
Comparison: none

[Series 1: us fetal bpp w/ non-stress · 14 acquisitions, 14 frames shown]
[im 1/14]
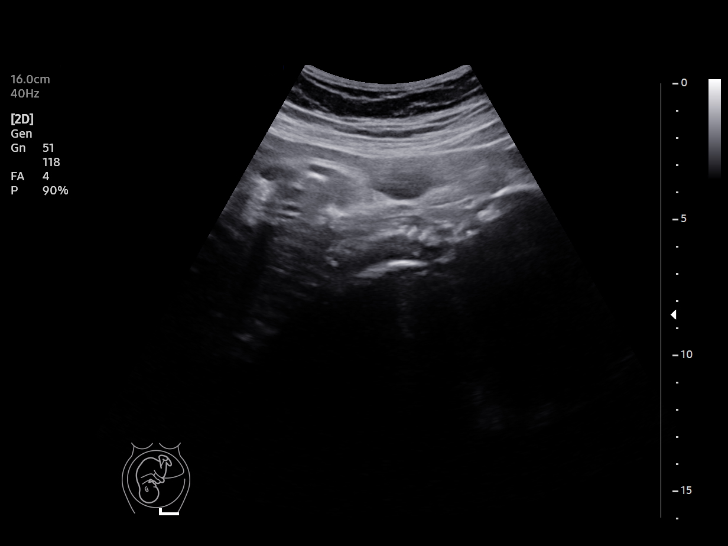
[im 2/14]
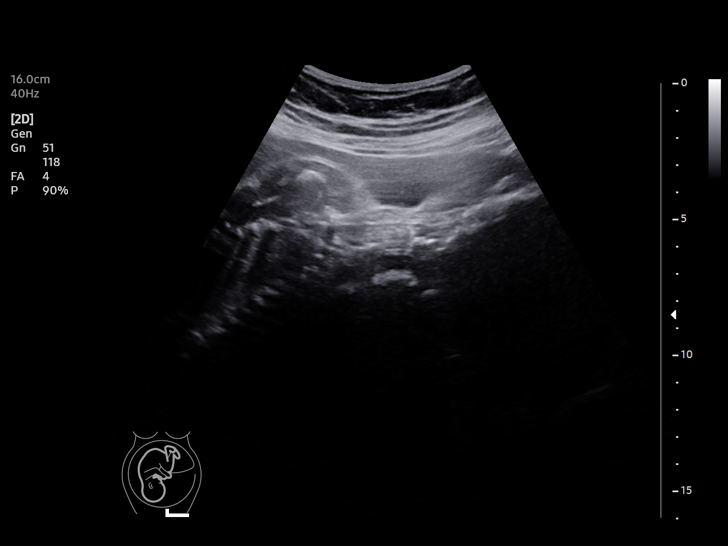
[im 3/14]
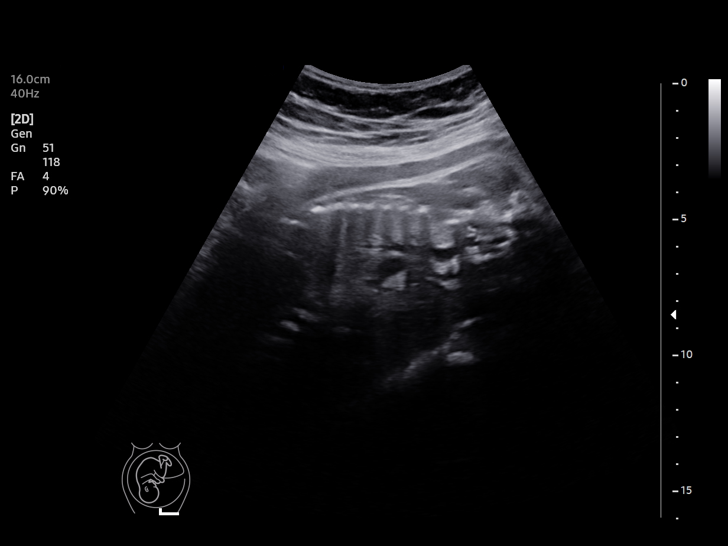
[im 4/14]
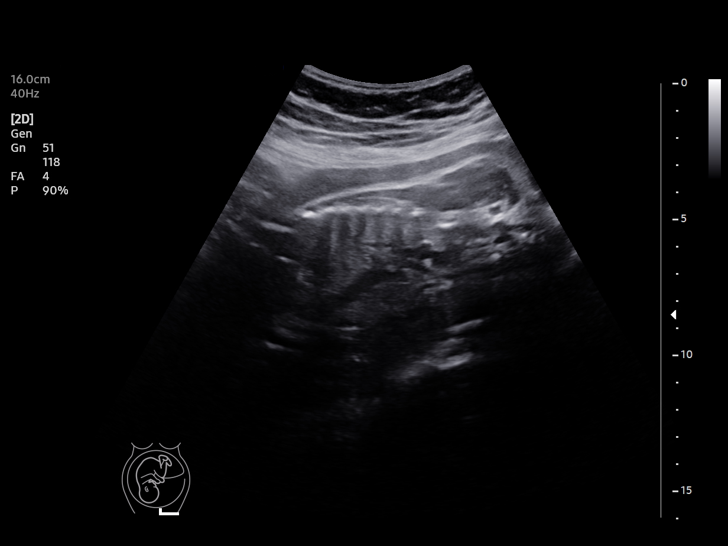
[im 5/14]
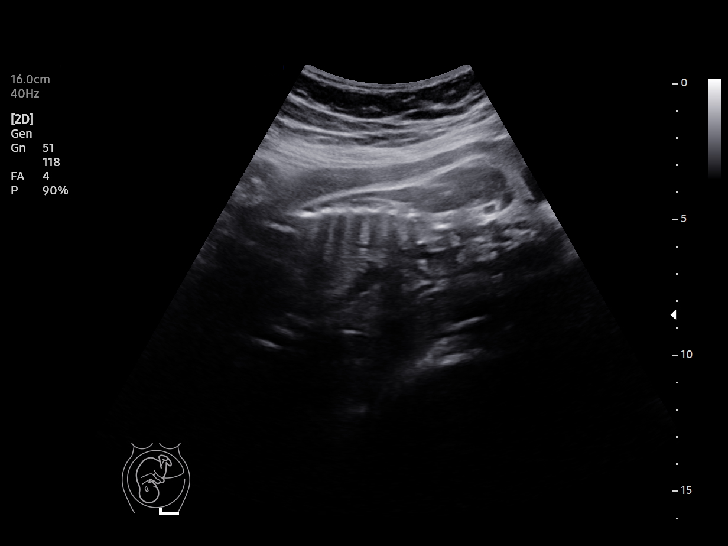
[im 6/14]
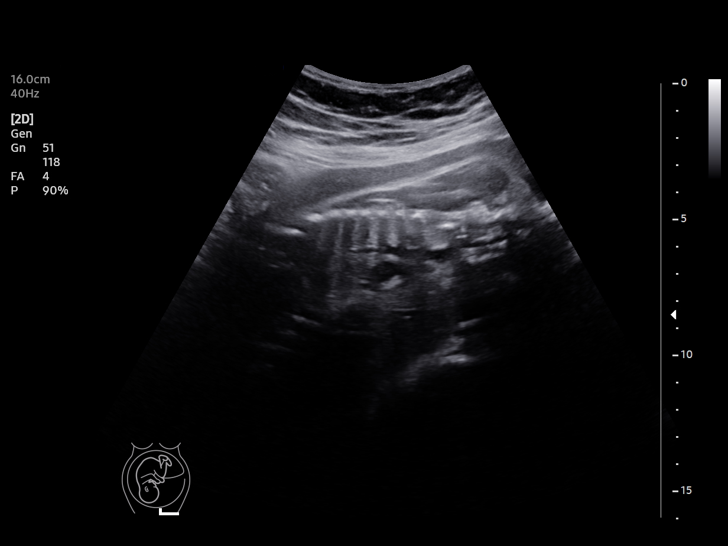
[im 7/14]
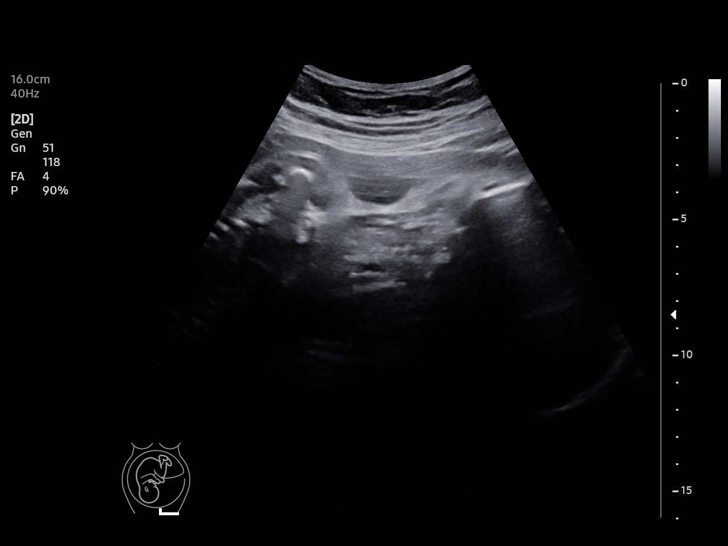
[im 8/14]
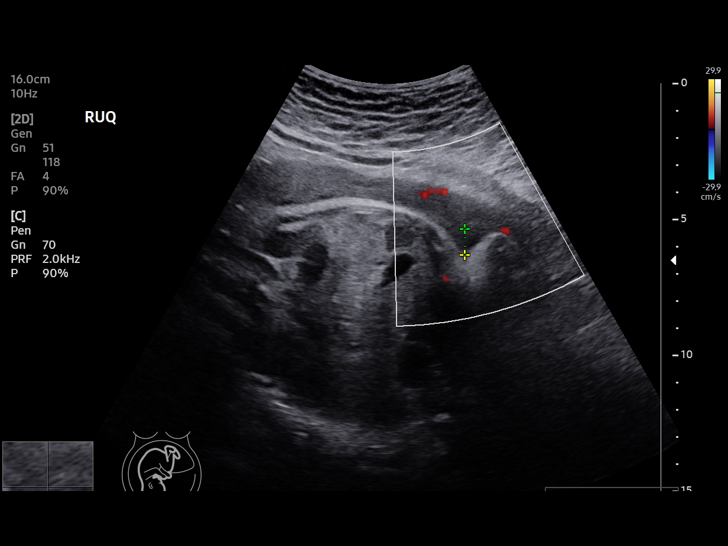
[im 9/14]
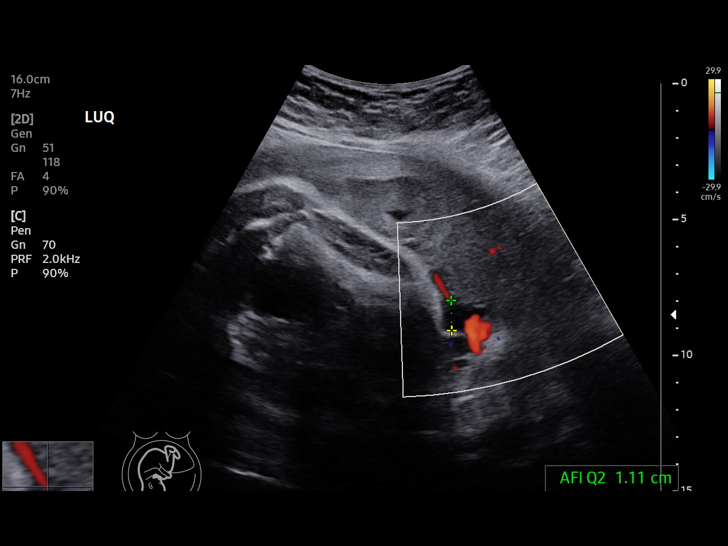
[im 10/14]
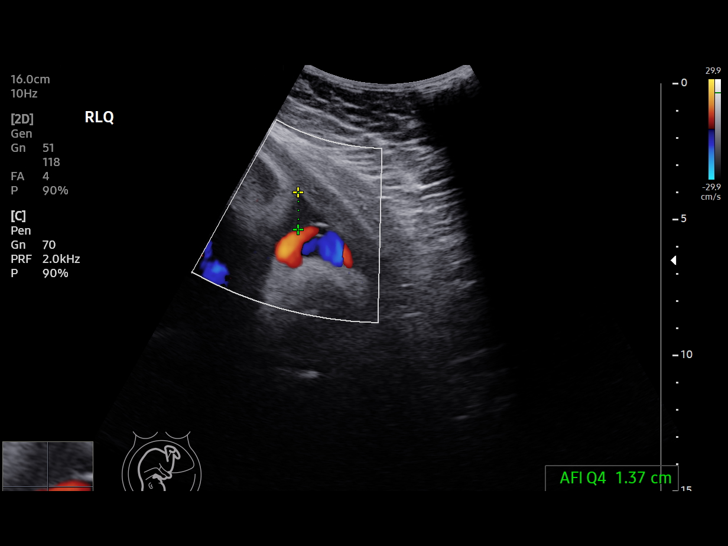
[im 11/14]
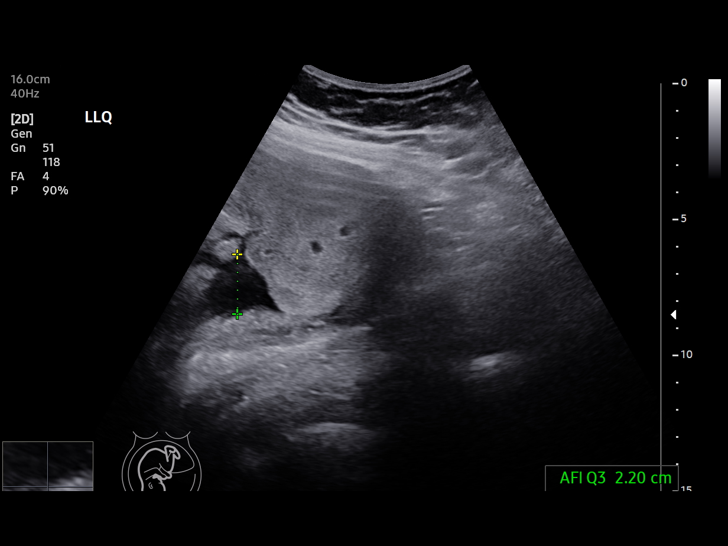
[im 12/14]
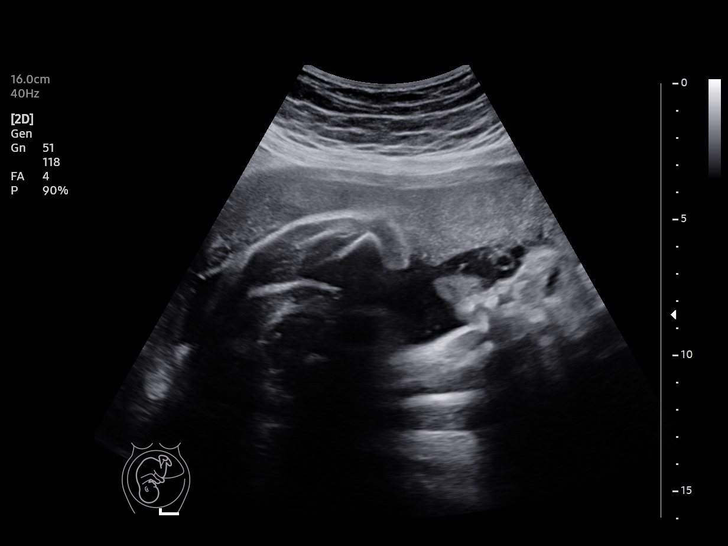
[im 13/14]
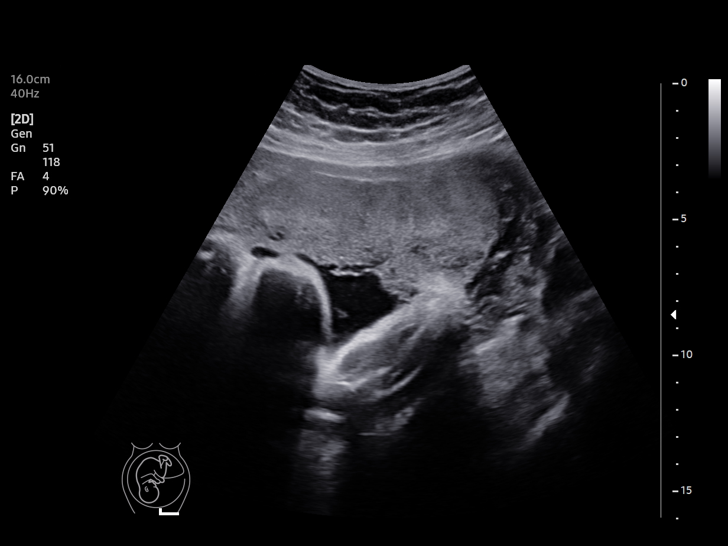
[im 14/14]
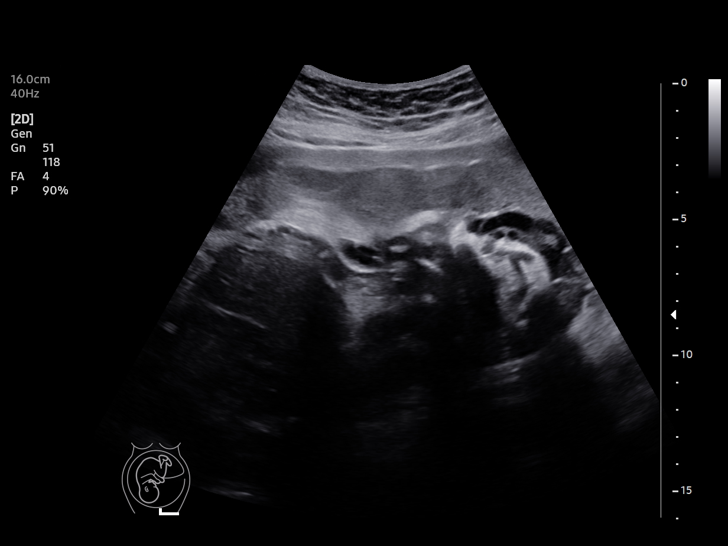

[14 of 14 positions shown; findings below may reference images not displayed]

[REDACTED]care at

 1  US FETAL BPP W/NONSTRESS              76818.4     [place]

Service(s) Provided

Indications

 Weeks of gestation of pregnancy not
 specified
 Mild to moderate preeclampsia, third
 trimester
 Oligohydraminios, third trimester, unspecified
Fetal Evaluation

 Num Of Fetuses:         1
 Preg. Location:         Intrauterine
 Cardiac Activity:       Observed
 Presentation:           Cephalic

 Amniotic Fluid
 AFI FV:      Oligohydramnios

 AFI Sum(cm)                 Largest Pocket(cm)


 RUQ(cm)       RLQ(cm)       LUQ(cm)        LLQ(cm)
 1
Biophysical Evaluation
 Amniotic F.V:   Pocket => 2 cm             F. Tone:        Observed
 F. Movement:    Observed                   N.S.T:          Reactive
 F. Breathing:   Observed                   Score:          [DATE]
Impression

 BPP [DATE]
 Oligohydramnios
Recommendations

 To move to delivery
                  [employer], [employer]
